# Patient Record
Sex: Female | Born: 1985 | Race: Black or African American | Hispanic: No | Marital: Single | Smoking: Former smoker
Health system: Southern US, Community
[De-identification: ages and names within clinical notes are randomized; demographics above are authoritative.]

## PROBLEM LIST (undated history)

## (undated) ENCOUNTER — Inpatient Hospital Stay (HOSPITAL_COMMUNITY): Payer: Self-pay

## (undated) DIAGNOSIS — K589 Irritable bowel syndrome without diarrhea: Secondary | ICD-10-CM

## (undated) DIAGNOSIS — B977 Papillomavirus as the cause of diseases classified elsewhere: Secondary | ICD-10-CM

## (undated) DIAGNOSIS — F32A Depression, unspecified: Secondary | ICD-10-CM

## (undated) DIAGNOSIS — F419 Anxiety disorder, unspecified: Secondary | ICD-10-CM

## (undated) DIAGNOSIS — K219 Gastro-esophageal reflux disease without esophagitis: Secondary | ICD-10-CM

## (undated) DIAGNOSIS — E559 Vitamin D deficiency, unspecified: Secondary | ICD-10-CM

## (undated) DIAGNOSIS — D649 Anemia, unspecified: Secondary | ICD-10-CM

## (undated) DIAGNOSIS — F329 Major depressive disorder, single episode, unspecified: Secondary | ICD-10-CM

## (undated) HISTORY — DX: Vitamin D deficiency, unspecified: E55.9

## (undated) HISTORY — PX: ESOPHAGOGASTRODUODENOSCOPY ENDOSCOPY: SHX5814

## (undated) HISTORY — PX: NO PAST SURGERIES: SHX2092

## (undated) HISTORY — DX: Papillomavirus as the cause of diseases classified elsewhere: B97.7

---
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
## 2006-08-27 ENCOUNTER — Ambulatory Visit: Payer: Self-pay | Admitting: Family Medicine

## 2006-08-27 ENCOUNTER — Inpatient Hospital Stay (HOSPITAL_COMMUNITY): Admission: AD | Admit: 2006-08-27 | Discharge: 2006-08-27 | Payer: Self-pay | Admitting: Obstetrics and Gynecology

## 2006-09-17 ENCOUNTER — Ambulatory Visit (HOSPITAL_COMMUNITY): Admission: RE | Admit: 2006-09-17 | Discharge: 2006-09-17 | Payer: Self-pay | Admitting: Family Medicine

## 2007-02-01 ENCOUNTER — Inpatient Hospital Stay (HOSPITAL_COMMUNITY): Admission: AD | Admit: 2007-02-01 | Discharge: 2007-02-01 | Payer: Self-pay | Admitting: Obstetrics and Gynecology

## 2007-02-02 ENCOUNTER — Inpatient Hospital Stay (HOSPITAL_COMMUNITY): Admission: AD | Admit: 2007-02-02 | Discharge: 2007-02-02 | Payer: Self-pay | Admitting: Obstetrics and Gynecology

## 2007-02-04 ENCOUNTER — Inpatient Hospital Stay (HOSPITAL_COMMUNITY): Admission: AD | Admit: 2007-02-04 | Discharge: 2007-02-07 | Payer: Self-pay | Admitting: Obstetrics and Gynecology

## 2007-02-05 ENCOUNTER — Encounter (INDEPENDENT_AMBULATORY_CARE_PROVIDER_SITE_OTHER): Payer: Self-pay | Admitting: Obstetrics and Gynecology

## 2007-10-03 ENCOUNTER — Emergency Department (HOSPITAL_COMMUNITY): Admission: EM | Admit: 2007-10-03 | Discharge: 2007-10-03 | Payer: Self-pay | Admitting: Emergency Medicine

## 2008-04-15 ENCOUNTER — Emergency Department (HOSPITAL_COMMUNITY): Admission: EM | Admit: 2008-04-15 | Discharge: 2008-04-15 | Payer: Self-pay | Admitting: Emergency Medicine

## 2008-11-13 ENCOUNTER — Emergency Department (HOSPITAL_COMMUNITY): Admission: EM | Admit: 2008-11-13 | Discharge: 2008-11-13 | Payer: Self-pay | Admitting: Emergency Medicine

## 2009-04-04 ENCOUNTER — Encounter (INDEPENDENT_AMBULATORY_CARE_PROVIDER_SITE_OTHER): Payer: Self-pay | Admitting: *Deleted

## 2009-04-04 ENCOUNTER — Emergency Department (HOSPITAL_COMMUNITY): Admission: EM | Admit: 2009-04-04 | Discharge: 2009-04-04 | Payer: Self-pay | Admitting: Emergency Medicine

## 2009-04-05 ENCOUNTER — Encounter (INDEPENDENT_AMBULATORY_CARE_PROVIDER_SITE_OTHER): Payer: Self-pay | Admitting: *Deleted

## 2009-04-26 ENCOUNTER — Ambulatory Visit: Payer: Self-pay | Admitting: Gastroenterology

## 2009-04-26 DIAGNOSIS — R1013 Epigastric pain: Secondary | ICD-10-CM

## 2009-04-26 DIAGNOSIS — K219 Gastro-esophageal reflux disease without esophagitis: Secondary | ICD-10-CM | POA: Insufficient documentation

## 2009-04-26 DIAGNOSIS — K625 Hemorrhage of anus and rectum: Secondary | ICD-10-CM | POA: Insufficient documentation

## 2009-04-27 ENCOUNTER — Ambulatory Visit: Payer: Self-pay | Admitting: Gastroenterology

## 2009-04-28 ENCOUNTER — Telehealth: Payer: Self-pay | Admitting: Gastroenterology

## 2009-04-29 ENCOUNTER — Telehealth: Payer: Self-pay | Admitting: Gastroenterology

## 2009-09-25 ENCOUNTER — Emergency Department (HOSPITAL_COMMUNITY): Admission: EM | Admit: 2009-09-25 | Discharge: 2009-09-26 | Payer: Self-pay | Admitting: Emergency Medicine

## 2009-10-04 ENCOUNTER — Emergency Department (HOSPITAL_COMMUNITY): Admission: EM | Admit: 2009-10-04 | Discharge: 2009-10-04 | Payer: Self-pay | Admitting: Emergency Medicine

## 2009-10-19 ENCOUNTER — Emergency Department (HOSPITAL_COMMUNITY)
Admission: EM | Admit: 2009-10-19 | Discharge: 2009-10-19 | Payer: Self-pay | Source: Home / Self Care | Admitting: Family Medicine

## 2009-10-19 ENCOUNTER — Emergency Department (HOSPITAL_COMMUNITY)
Admission: EM | Admit: 2009-10-19 | Discharge: 2009-10-19 | Payer: Self-pay | Source: Home / Self Care | Admitting: Emergency Medicine

## 2009-10-24 ENCOUNTER — Emergency Department (HOSPITAL_COMMUNITY)
Admission: EM | Admit: 2009-10-24 | Discharge: 2009-10-24 | Payer: Self-pay | Source: Home / Self Care | Admitting: Emergency Medicine

## 2009-12-18 ENCOUNTER — Emergency Department (HOSPITAL_COMMUNITY): Admission: EM | Admit: 2009-12-18 | Discharge: 2009-12-18 | Payer: Self-pay | Admitting: Emergency Medicine

## 2010-01-14 ENCOUNTER — Emergency Department (HOSPITAL_COMMUNITY)
Admission: EM | Admit: 2010-01-14 | Discharge: 2010-01-14 | Payer: Self-pay | Source: Home / Self Care | Admitting: Emergency Medicine

## 2010-02-20 ENCOUNTER — Emergency Department (HOSPITAL_COMMUNITY)
Admission: EM | Admit: 2010-02-20 | Discharge: 2010-02-20 | Payer: Self-pay | Source: Home / Self Care | Admitting: Family Medicine

## 2010-03-16 ENCOUNTER — Emergency Department (HOSPITAL_COMMUNITY)
Admission: EM | Admit: 2010-03-16 | Discharge: 2010-03-16 | Payer: Self-pay | Source: Home / Self Care | Admitting: Family Medicine

## 2010-03-16 LAB — POCT URINALYSIS DIPSTICK
Bilirubin Urine: NEGATIVE
Hemoglobin, Urine: NEGATIVE
Ketones, ur: NEGATIVE mg/dL
Nitrite: NEGATIVE
Protein, ur: NEGATIVE mg/dL
Specific Gravity, Urine: 1.025 (ref 1.005–1.030)
Urine Glucose, Fasting: NEGATIVE mg/dL
Urobilinogen, UA: 0.2 mg/dL (ref 0.0–1.0)
pH: 6 (ref 5.0–8.0)

## 2010-03-16 LAB — WET PREP, GENITAL
Trich, Wet Prep: NONE SEEN
Yeast Wet Prep HPF POC: NONE SEEN

## 2010-03-16 LAB — POCT PREGNANCY, URINE: Preg Test, Ur: NEGATIVE

## 2010-03-17 LAB — GC/CHLAMYDIA PROBE AMP, GENITAL
Chlamydia, DNA Probe: NEGATIVE
GC Probe Amp, Genital: NEGATIVE

## 2010-04-12 NOTE — Progress Notes (Signed)
Summary: Call Report  Phone Note Other Incoming   Caller: [facility] Call Report Summary of Call: [facility] Triage Call Report Triage Record Num: [id number] Operator: Patriciaann Clan Patient Name: Jodi Cochran Call Date & Time: 04/28/2009 7:58:14PM Patient Phone: [phone] PCP: Barbette Hair. Arlyce Dice Patient Gender: Female PCP Fax : Patient DOB: 1985/09/19 Practice Name: [facility] Reason for Call: LMP 04/13/2009. Patient calling. States she had upper endoscopy 04/27/09. Patient states she developed fever, onset 04/28/09 1820. Temp. 99.2 @1930  04/28/09. States has had nasal congestion. Onset X 3 weeks. Patient states feels fullness and pressure in face. Patient advised, saline nasal washes, inhaled steam, warm compresses to face, fluids. Call back parameters reviewed. Patient advised to call office 04/29/09 a.m. to schedule appt. within 24 hours. Patient also states developed pain and burning in left lower leg, onset 04/28/09 1900. Care advice given. Call back parameters reviewed. Protocol(s) Used: Upper Respiratory Infections / Colds Recommended Outcome per Protocol: See Provider within 24 hours Reason for Outcome: Facial pain (fullness, pressure, worsens with bending over), frontal headache, yellow-green nasal discharge AND any temperature elevation Care Advice:  ~ Use a cool mist humidifier to moisten air. Be sure to clean according to manufacturer's instructions.  ~ See provider in 8 hours if redness, swelling and tenderness develops above or below eyes/near ears/over sinuses  ~ Consider use of a saline nasal spray per package directions to help relieve nasal congestion. Consider acetaminophen as directed on label or by pharmacist/provider for pain or fever. PRECAUTIONS: - Use only If there is no history of liver disease, alcoholism, or intake of three or more alcohol drinks per day. - If approved by provider when breastfeeding. - Do not exceed recommended dose or frequency.  ~  ~ SYMPTOM / CONDITION MANAGEMENT A warm, moist compress placed on face, over eyes for 15 to 20 minutes, 5 to 6 times a day, may help relieve the congestion.  Initial call taken by: Margaret Pyle, CMA,  April 29, 2009 8:31 AM  Follow-up for Phone Call        ok Follow-up by: Louis Meckel MD,  April 29, 2009 11:12 AM     Appended Document: Call Report Calling to get a condition update from patient, unable to leave a message on pt. phone, I will try again later.   Appended Document: Call Report Pt. states "I am doing fine" Pt. instructed to call back as needed.

## 2010-04-12 NOTE — Progress Notes (Signed)
Summary: Call Report  Phone Note Other Incoming   Caller: [facility] Call Report Summary of Call: [facility] Triage Call Report Triage Record Num: [id number] Operator: Patriciaann Clan Patient Name: Jodi Cochran Call Date & Time: 04/28/2009 7:58:14PM Patient Phone: [phone] PCP: Barbette Hair. Arlyce Dice Patient Gender: Female PCP Fax : Patient DOB: 1986-01-08 Practice Name: [facility] Reason for Call: LMP 04/13/2009. Patient calling. States she had upper endoscopy 04/27/09. Patient states she developed fever, onset 04/28/09 1820. Temp. 99.2 @1930  04/28/09. States has had nasal congestion. Onset X 3 weeks. Patient states feels fullness and pressure in face. Patient advised, saline nasal washes, inhaled steam, warm compresses to face, fluids. Call back parameters reviewed. Patient advised to call office 04/29/09 a.m. to schedule appt. within 24 hours. Patient also states developed pain and burning in left lower leg, onset 04/28/09 1900. Care advice given. Call back parameters reviewed. Protocol(s) Used: Lower Leg Non-Injury Recommended Outcome per Protocol: See Provider within 24 hours Reason for Outcome: New onset mild to moderate pain that has not improved with 24 hours of home care Care Advice:  ~ Call provider if symptoms worsen or new symptoms develop. Consider acetaminophen as directed on label or by pharmacist/provider for pain or fever PRECAUTIONS: - If there is no history of liver disease, alcoholism, or intake of three or more alcohol drinks per day - If approved by provider during pregnancy or when breastfeeding - During pregnancy, acetaminophen should not be taken more than 3 consecutive days without telling provider - Do not exceed recommended dose or frequency  ~ Position affected part so it is elevated at least 12 inches (30 cm) above level of heart to improve circulation and decrease discomfort.  ~  Initial call taken by: Margaret Pyle, CMA,  April 29, 2009  8:35 AM

## 2010-04-12 NOTE — Letter (Signed)
Summary: Out of Work  [facility]  [address] [place], [place] [address]   Phone: [phone]  Fax: [phone]    04/26/2009  TO: WHOM IT MAY CONCERN  RE: Jodi Cochran [address] [place]       The above named individual is currently under my care and will be out of work    FROM: 04/26/2009   THROUGH: 04/28/2009    REASON: PROCEDURES    MAY RETURN ON: 04/28/2009     If you have any further questions or need additional information, please call.     Sincerely,    typed by: Merri Ray CMA (AAMA)

## 2010-04-12 NOTE — Progress Notes (Signed)
Summary: phone note  Phone Note Call from Patient   Summary of Call: Pt returning call from follow up call this a.m. after procedure.  Follow-up for Phone Call        Pt states she is having no pain, able to eat without problems.  Reviewed report with pt.  Instructed to call office in the next few days to set up follow up appt for 2-3 weeks. No further questions Follow-up by: Karl Bales RN,  April 28, 2009 9:28 AM

## 2010-04-12 NOTE — Letter (Signed)
Summary: Results Letter  [facility]  [address] [place], [place] [address]   Phone: [phone]  Fax: [phone]        April 26, 2009 MRN: [id number]    Jodi Cochran [address] [place], [place]  [address]    Dear [name],  It is my pleasure to have treated you recently as a new patient in my office. I appreciate your confidence and the opportunity to participate in your care.  Since I do have a busy inpatient endoscopy schedule and office schedule, my office hours vary weekly. I am, however, available for emergency calls everyday through my office. If I am not available for an urgent office appointment, another one of our gastroenterologist will be able to assist you.  My well-trained staff are prepared to help you at all times. For emergencies after office hours, a physician from our Gastroenterology section is always available through my 24 hour answering service  Once again I welcome you as a new patient and I look forward to a happy and healthy relationship             Sincerely,  Louis Meckel MD  This letter has been electronically signed by your physician.  Appended Document: Results Letter letter mailed

## 2010-04-12 NOTE — Letter (Signed)
Summary: EGD Instructions  [facility]  [address] [place], [place] [address]   Phone: [phone]  Fax: [phone]       Jodi Cochran    [birth year]    MRN: [id number]       Procedure Day /Date:TUESDAY 04/27/2009     Arrival Time: 10:30AM     Procedure Time:11:30AM     Location of Procedure:                    X  [facility] (4th Floor)   PREPARATION FOR ENDOSCOPY   On 04/27/2009  THE DAY OF THE PROCEDURE:  1.   No solid foods, milk or milk products are allowed after midnight the night before your procedure.  2.   Do not drink anything colored red or purple.  Avoid juices with pulp.  No orange juice.  3.  You may drink clear liquids until 9:30AM which is 2 hours before your procedure.                                                                                                CLEAR LIQUIDS INCLUDE: Water Jello Ice Popsicles Tea (sugar ok, no milk/cream) Powdered fruit flavored drinks Coffee (sugar ok, no milk/cream) Gatorade Juice: apple, white grape, white cranberry  Lemonade Clear bullion, consomm, broth Carbonated beverages (any kind) Strained chicken noodle soup Hard Candy   MEDICATION INSTRUCTIONS  Unless otherwise instructed, you should take regular prescription medications with a small sip of water as early as possible the morning of your procedure.            OTHER INSTRUCTIONS  You will need a responsible adult at least 25 years of age to accompany you and drive you home.   This person must remain in the waiting room during your procedure.  Wear loose fitting clothing that is easily removed.  Leave jewelry and other valuables at home.  However, you may wish to bring a book to read or an iPod/MP3 player to listen to music as you wait for your procedure to start.  Remove all body piercing jewelry and leave at home.  Total time from sign-in until discharge is approximately 2-3 hours.  You should go home  directly after your procedure and rest.  You can resume normal activities the day after your procedure.  The day of your procedure you should not:   Drive   Make legal decisions   Operate machinery   Drink alcohol   Return to work  You will receive specific instructions about eating, activities and medications before you leave.    The above instructions have been reviewed and explained to me by   _______________________    I fully understand and can verbalize these instructions _____________________________ Date _________

## 2010-04-12 NOTE — Procedures (Signed)
Summary: Upper Endoscopy  Patient: Jodi Cochran Note: All result statuses are Final unless otherwise noted.  Tests: (1) Upper Endoscopy (EGD)   EGD Upper Endoscopy       DONE     [facility]     [address]     [place], [place]  [address]           ENDOSCOPY PROCEDURE REPORT           PATIENT:  Jodi, Cochran  MR#:  [id number]     BIRTHDATE:  1985/10/05, 23 yrs. old  GENDER:  female           ENDOSCOPIST:  Barbette Hair. Arlyce Dice, MD     Referred by:           PROCEDURE DATE:  04/27/2009     PROCEDURE:  EGD, diagnostic     ASA CLASS:  Class I     INDICATIONS:  abdominal pain, GERD           MEDICATIONS:   Fentanyl 50 mcg IV, Versed 5 mg IV, glycopyrrolate     (Robinal) 0.2 mg IV, 0.6cc simethancone 0.6 cc PO     TOPICAL ANESTHETIC:  Exactacain Spray           DESCRIPTION OF PROCEDURE:   After the risks benefits and     alternatives of the procedure were thoroughly explained, informed     consent was obtained.  The [facility] GIF-H180 [id number] endoscope was     introduced through the mouth and advanced to the third portion of     the duodenum, without limitations.  The instrument was slowly     withdrawn as the mucosa was fully examined.     <<PROCEDUREIMAGES>>           The upper, middle, and distal third of the esophagus were     carefully inspected and no abnormalities were noted. The z-line     was well seen at the GEJ. The endoscope was pushed into the fundus     which was normal including a retroflexed view. The antrum,gastric     body, first and second part of the duodenum were unremarkable (see     image1, image2, image3, image4, image5, image6, image7, and     image9).    Retroflexed views revealed no abnormalities.    The     scope was then withdrawn from the patient and the procedure     completed.           COMPLICATIONS:  None           ENDOSCOPIC IMPRESSION:     1) Normal EGD     RECOMMENDATIONS:     1) continue PPI - dexilant     2) Call office next 2-3  days to schedule an office appointment     for 2-3 weeks           REPEAT EXAM:  No           ______________________________     Barbette Hair. Arlyce Dice, MD           CC:  Erlinda Hong MD           n.     Rosalie DoctorBarbette Hair. Zara Wendt at 04/27/2009 02:29 PM           [name], [id number]  Note: An exclamation mark (!) indicates a result that was not dispersed into the flowsheet. Document Creation Date: 04/27/2009 2:29  PM _______________________________________________________________________  (1) Order result status: Final Collection or observation date-time: 04/27/2009 14:25 Requested date-time:  Receipt date-time:  Reported date-time:  Referring Physician:   Ordering Physician: Melvia Heaps [phone]) Specimen Source:  Source: Launa Grill Order Number: [id number] Lab site:

## 2010-04-12 NOTE — Letter (Signed)
Summary: New Patient letter  [facility]  [address] [place], [place] [address]   Phone: [phone]  Fax: [phone]       04/05/2009 MRN: [id number]  Jodi Cochran [address] [place], [place]  [address]  Dear Ms. Saephanh,  Welcome to the Gastroenterology Division at [facility].    You are scheduled to see Dr.  Arlyce Dice on 04/26/2009 at 2:00PM on the 3rd floor at [facility], [address]  We ask that you try to arrive at our office 15 minutes prior to your appointment time to allow for check-in.  We would like you to complete the enclosed self-administered evaluation form prior to your visit and bring it with you on the day of your appointment.  We will review it with you.  Also, please bring a complete list of all your medications or, if you prefer, bring the medication bottles and we will list them.  Co-payments are due at the time of your visit and may be paid by cash, check or credit card.  Being a new patient to our practice and self-pay, your co-payment required at the time of check-in is $184.00.     Your office visit will consist of a consult with your physician (includes a physical exam), any laboratory testing he/she may order, scheduling of any necessary diagnostic testing (e.g. x-ray, ultrasound, CT-scan), and scheduling of a procedure (e.g. Endoscopy, Colonoscopy) if required.  Please allow enough time on your schedule to allow for any/all of these possibilities.    If you cannot keep your appointment, please call [phone] to cancel or reschedule prior to your appointment date.  This allows [place] the opportunity to schedule an appointment for another patient in need of care.  If you do not cancel or reschedule by 5 p.m. the business day prior to your appointment date, you will be charged a $50.00 late cancellation/no-show fee.    Thank you for choosing [facility] for your medical needs.  We appreciate the opportunity to care  for you.  Please visit [place] at our website  to learn more about our practice.                     Sincerely,                                                             The Gastroenterology Division

## 2010-04-12 NOTE — Assessment & Plan Note (Signed)
Summary: poss ulcer...as.   History of Present Illness Visit Type: Initial Visit Primary GI MD: Melvia Heaps MD [facility] Primary Provider: Erlinda Hong, MD Chief Complaint: constant burning in abdomen History of Present Illness:   Ms. Jodi Cochran is a 25 year old Afro-American female referred at the request of the [facility] for evaluation of abdominal pain.  She was seen 3 weeks ago for burning upper epigastric pain.  Workup including CBC, comprehensive metabolic panel, urinalysis and lipase were negative.  She was Hemoccult negative.  She has a history of pyrosis for which she had been taking Prevacid.  Prevacid initially helped the symptoms but symptoms recurred and worsened.  She takes ibuprofen sparingly.  For the past 3 weeks she has been off PPIs and complains of severe burning chest and upper abdominal discomfort.  It interferes with her sleep.  She has constant nausea though she has not thrown up.  She seen some blood on the toilet tissue with a bowel movement.  Her upper abdominal pain may radiate to both sides and to the back.  With the bowel movement she may have crampy lower, abdominal pain.   GI Review of Systems    Reports abdominal pain, acid reflux, belching, bloating, chest pain, loss of appetite, and  nausea.     Location of  Abdominal pain: epigastric area.    Denies dysphagia with liquids, dysphagia with solids, heartburn, vomiting, vomiting blood, weight loss, and  weight gain.      Reports black tarry stools, change in bowel habits, rectal bleeding, and  rectal pain.     Denies anal fissure, constipation, diarrhea, diverticulosis, fecal incontinence, heme positive stool, hemorrhoids, irritable bowel syndrome, jaundice, light color stool, and  liver problems. Preventive Screening-Counseling & Management  Alcohol-Tobacco     Smoking Status: quit      Drug Use:  no.      Current Medications (verified): 1)  Zicam Nasal Spray .... As Needed 2)  Ibuprofen 200 Mg Tabs  (Ibuprofen) .... As Needed For Pain  Allergies (verified): 1)  ! * Zofran  Past History:  Past Medical History: Hypertension  Past Surgical History: Unremarkable  Family History: No FH of Colon Cancer: Family History of Diabetes: GM  Family History of Heart Disease: Uncle  Social History: Occupation: Early Childhood Education Patient is a former smoker.  Alcohol Use - no Daily Caffeine Use Illicit Drug Use - no Smoking Status:  quit Drug Use:  no  Review of Systems       The patient complains of allergy/sinus, back pain, headaches-new, night sweats, and shortness of breath.         All other systems were reviewed and were negative   Vital Signs:  Patient profile:   25 year old female Height:      66.25 inches Weight:      126.50 pounds BMI:     20.34 Pulse rate:   68 / minute Pulse rhythm:   regular BP sitting:   112 / 68  (left arm) Cuff size:   regular  Vitals Entered By: June McMurray CMA Duncan Dull) (April 26, 2009 2:15 PM)  Physical Exam  Additional Exam:  She is a thin female in no acute distress  skin: anicteric HEENT: normocephalic; PEERLA; no nasal or pharyngeal abnormalities neck: supple nodes: no cervical lymphadenopathy chest: clear to ausculatation and percussion heart: no murmurs, gallops, or rubs abd: soft, nontender; BS normoactive; no abdominal masses, , organomegaly; there is mild tenderness to palpation in the midepigastrium rectal:  deferred ext: no cynanosis, clubbing, edema skeletal: no deformities neuro: oriented x 3; no focal abnormalities    Impression & Recommendations:  Problem # 1:  ABDOMINAL PAIN, EPIGASTRIC (ICD-789.06) Symptoms could be due to bird with or without ulcer or nonulcer dyspepsia.  Recommendations #1 child DEXILANT 60 mg daily #2 upper endoscopy  Risks, alternatives, and complications of the procedure, including bleeding, perforation, and possible need for surgery, were explained to the patient.   Patient's questions were answered.  Orders: EGD (EGD)  Problem # 2:  ESOPHAGEAL REFLUX (ICD-530.81) Plan trial of DEXILANT Orders: EGD (EGD)  Problem # 3:  HEMORRHAGE OF RECTUM AND ANUS (ICD-569.3) This is most likely related to hemorrhoidal bleeding.  Patient is Hemoccult-negative at her previousr visit.  Recommendations #1 no further workup unless bleeding persists at which point I would consider a sigmoidoscopy  Patient Instructions: 1)  Conscious Sedation brochure given.  2)  Upper Endoscopy brochure given.

## 2010-05-24 LAB — POCT URINALYSIS DIPSTICK
Bilirubin Urine: NEGATIVE
Glucose, UA: NEGATIVE mg/dL
Hgb urine dipstick: NEGATIVE
Ketones, ur: NEGATIVE mg/dL
Nitrite: NEGATIVE
Protein, ur: NEGATIVE mg/dL
Specific Gravity, Urine: 1.02 (ref 1.005–1.030)
Urobilinogen, UA: 0.2 mg/dL (ref 0.0–1.0)
pH: 7 (ref 5.0–8.0)

## 2010-05-24 LAB — WET PREP, GENITAL
Trich, Wet Prep: NONE SEEN
Yeast Wet Prep HPF POC: NONE SEEN

## 2010-05-24 LAB — POCT PREGNANCY, URINE: Preg Test, Ur: NEGATIVE

## 2010-05-24 LAB — GC/CHLAMYDIA PROBE AMP, GENITAL
Chlamydia, DNA Probe: NEGATIVE
GC Probe Amp, Genital: NEGATIVE

## 2010-05-26 LAB — POCT URINALYSIS DIPSTICK
Bilirubin Urine: NEGATIVE
Glucose, UA: NEGATIVE mg/dL
Hgb urine dipstick: NEGATIVE
Ketones, ur: NEGATIVE mg/dL
Nitrite: NEGATIVE
Protein, ur: NEGATIVE mg/dL
Specific Gravity, Urine: 1.02 (ref 1.005–1.030)
Urobilinogen, UA: 0.2 mg/dL (ref 0.0–1.0)
pH: 7 (ref 5.0–8.0)

## 2010-05-26 LAB — POCT PREGNANCY, URINE: Preg Test, Ur: NEGATIVE

## 2010-05-27 LAB — WET PREP, GENITAL
Clue Cells Wet Prep HPF POC: NONE SEEN
Trich, Wet Prep: NONE SEEN
Yeast Wet Prep HPF POC: NONE SEEN

## 2010-05-27 LAB — POCT URINALYSIS DIPSTICK
Hgb urine dipstick: NEGATIVE
Ketones, ur: NEGATIVE mg/dL
Protein, ur: NEGATIVE mg/dL
Specific Gravity, Urine: 1.025 (ref 1.005–1.030)

## 2010-05-27 LAB — CBC
HCT: 37.8 % (ref 36.0–46.0)
Hemoglobin: 12.7 g/dL (ref 12.0–15.0)
MCH: 28.9 pg (ref 26.0–34.0)
MCHC: 33.7 g/dL (ref 30.0–36.0)
MCHC: 33.1 g/dL (ref 30.0–36.0)
MCV: 86 fL (ref 78.0–100.0)
Platelets: 236 10*3/uL (ref 150–400)
Platelets: 245 10*3/uL (ref 150–400)
RBC: 4.4 MIL/uL (ref 3.87–5.11)
RDW: 12.7 % (ref 11.5–15.5)
RDW: 12.7 % (ref 11.5–15.5)
WBC: 6.7 10*3/uL (ref 4.0–10.5)

## 2010-05-27 LAB — URINALYSIS, ROUTINE W REFLEX MICROSCOPIC
Bilirubin Urine: NEGATIVE
Glucose, UA: NEGATIVE mg/dL
Glucose, UA: NEGATIVE mg/dL
Hgb urine dipstick: NEGATIVE
Ketones, ur: NEGATIVE mg/dL
Nitrite: NEGATIVE
Protein, ur: NEGATIVE mg/dL
Specific Gravity, Urine: 1.005 (ref 1.005–1.030)
Urobilinogen, UA: 0.2 mg/dL (ref 0.0–1.0)
pH: 6 (ref 5.0–8.0)
pH: 5.5 (ref 5.0–8.0)

## 2010-05-27 LAB — POCT I-STAT, CHEM 8
BUN: 17 mg/dL (ref 6–23)
BUN: 13 mg/dL (ref 6–23)
Calcium, Ion: 1.07 mmol/L — ABNORMAL LOW (ref 1.12–1.32)
Calcium, Ion: 1.22 mmol/L (ref 1.12–1.32)
Calcium, Ion: 1.23 mmol/L (ref 1.12–1.32)
Chloride: 107 mEq/L (ref 96–112)
Chloride: 106 mEq/L (ref 96–112)
Chloride: 106 mEq/L (ref 96–112)
Creatinine, Ser: 0.8 mg/dL (ref 0.4–1.2)
Creatinine, Ser: 0.6 mg/dL (ref 0.4–1.2)
Glucose, Bld: 85 mg/dL (ref 70–99)
HCT: 41 % (ref 36.0–46.0)
HCT: 37 % (ref 36.0–46.0)
Hemoglobin: 13.9 g/dL (ref 12.0–15.0)
Potassium: 6.8 mEq/L (ref 3.5–5.1)
Potassium: 3.6 mEq/L (ref 3.5–5.1)
Sodium: 132 mEq/L — ABNORMAL LOW (ref 135–145)
TCO2: 23 mmol/L (ref 0–100)

## 2010-05-27 LAB — URINE CULTURE
Colony Count: NO GROWTH
Culture  Setup Time: 201108142122
Culture: NO GROWTH

## 2010-05-27 LAB — BASIC METABOLIC PANEL
BUN: 12 mg/dL (ref 6–23)
CO2: 22 mEq/L (ref 19–32)
Calcium: 9.7 mg/dL (ref 8.4–10.5)
Chloride: 107 mEq/L (ref 96–112)
Creatinine, Ser: 0.7 mg/dL (ref 0.4–1.2)
GFR calc Af Amer: >60 mL/min (ref >60)
GFR calc non Af Amer: >60 mL/min (ref >60)
Glucose, Bld: 91 mg/dL (ref 70–99)
Potassium: 4.1 mEq/L (ref 3.5–5.1)
Sodium: 135 mEq/L (ref 135–145)

## 2010-05-27 LAB — POCT PREGNANCY, URINE
Preg Test, Ur: NEGATIVE
Preg Test, Ur: NEGATIVE
Preg Test, Ur: NEGATIVE

## 2010-05-27 LAB — DIFFERENTIAL
Basophils Absolute: 0 10*3/uL (ref 0.0–0.1)
Basophils Absolute: 0 10*3/uL (ref 0.0–0.1)
Basophils Relative: 1 % (ref 0–1)
Basophils Relative: 0 % (ref 0–1)
Eosinophils Absolute: 0.1 10*3/uL (ref 0.0–0.7)
Eosinophils Relative: 2 % (ref 0–5)
Lymphocytes Relative: 26 % (ref 12–46)
Lymphs Abs: 1.7 10*3/uL (ref 0.7–4.0)
Monocytes Absolute: 0.4 10*3/uL (ref 0.1–1.0)
Monocytes Relative: 6 % (ref 3–12)
Neutro Abs: 4.4 10*3/uL (ref 1.7–7.7)
Neutro Abs: 3.2 10*3/uL (ref 1.7–7.7)
Neutrophils Relative %: 66 % (ref 43–77)
Neutrophils Relative %: 52 % (ref 43–77)

## 2010-05-27 LAB — URINE MICROSCOPIC-ADD ON

## 2010-05-27 LAB — GC/CHLAMYDIA PROBE AMP, GENITAL: Chlamydia, DNA Probe: NEGATIVE

## 2010-05-28 LAB — POCT PREGNANCY, URINE: Preg Test, Ur: NEGATIVE

## 2010-05-28 LAB — COMPREHENSIVE METABOLIC PANEL
Alkaline Phosphatase: 72 U/L (ref 39–117)
BUN: 10 mg/dL (ref 6–23)
CO2: 24 mEq/L (ref 19–32)
Chloride: 108 mEq/L (ref 96–112)
GFR calc non Af Amer: >60 mL/min (ref >60)
Glucose, Bld: 80 mg/dL (ref 70–99)
Potassium: 3.6 mEq/L (ref 3.5–5.1)
Total Bilirubin: 0.4 mg/dL (ref 0.3–1.2)
Total Protein: 7.2 g/dL (ref 6.0–8.3)

## 2010-05-28 LAB — URINALYSIS, ROUTINE W REFLEX MICROSCOPIC
Bilirubin Urine: NEGATIVE
Nitrite: NEGATIVE
Specific Gravity, Urine: 1.027 (ref 1.005–1.030)
Urobilinogen, UA: 0.2 mg/dL (ref 0.0–1.0)

## 2010-05-28 LAB — DIFFERENTIAL
Basophils Absolute: 0 10*3/uL (ref 0.0–0.1)
Basophils Relative: 0 % (ref 0–1)
Eosinophils Absolute: 0.1 10*3/uL (ref 0.0–0.7)
Neutro Abs: 3.9 10*3/uL (ref 1.7–7.7)
Neutrophils Relative %: 53 % (ref 43–77)

## 2010-05-28 LAB — CBC
HCT: 37.5 % (ref 36.0–46.0)
Hemoglobin: 12.6 g/dL (ref 12.0–15.0)
MCV: 87 fL (ref 78.0–100.0)
RDW: 12.7 % (ref 11.5–15.5)
WBC: 7.3 10*3/uL (ref 4.0–10.5)

## 2010-05-28 LAB — URINE MICROSCOPIC-ADD ON

## 2010-05-28 LAB — GC/CHLAMYDIA PROBE AMP, GENITAL: GC Probe Amp, Genital: NEGATIVE

## 2010-05-29 LAB — COMPREHENSIVE METABOLIC PANEL
ALT: 14 U/L (ref 0–35)
AST: 21 U/L (ref 0–37)
CO2: 24 mEq/L (ref 19–32)
Calcium: 9.2 mg/dL (ref 8.4–10.5)
Chloride: 103 mEq/L (ref 96–112)
Creatinine, Ser: 0.64 mg/dL (ref 0.4–1.2)
GFR calc non Af Amer: >60 mL/min (ref >60)
Glucose, Bld: 85 mg/dL (ref 70–99)
Sodium: 134 mEq/L — ABNORMAL LOW (ref 135–145)
Total Bilirubin: 0.1 mg/dL — ABNORMAL LOW (ref 0.3–1.2)

## 2010-05-29 LAB — CBC
HCT: 35.8 % — ABNORMAL LOW (ref 36.0–46.0)
Hemoglobin: 11.8 g/dL — ABNORMAL LOW (ref 12.0–15.0)
MCHC: 33 g/dL (ref 30.0–36.0)
MCV: 86 fL (ref 78.0–100.0)
Platelets: 230 10*3/uL (ref 150–400)
RBC: 4.16 MIL/uL (ref 3.87–5.11)
RDW: 13 % (ref 11.5–15.5)
WBC: 4.9 10*3/uL (ref 4.0–10.5)

## 2010-05-29 LAB — DIFFERENTIAL
Basophils Absolute: 0 10*3/uL (ref 0.0–0.1)
Basophils Relative: 0 % (ref 0–1)
Eosinophils Absolute: 0.1 10*3/uL (ref 0.0–0.7)
Eosinophils Relative: 2 % (ref 0–5)
Lymphocytes Relative: 44 % (ref 12–46)
Lymphs Abs: 2.1 10*3/uL (ref 0.7–4.0)
Monocytes Absolute: 0.5 10*3/uL (ref 0.1–1.0)
Monocytes Relative: 11 % (ref 3–12)
Neutro Abs: 2 10*3/uL (ref 1.7–7.7)
Neutrophils Relative %: 42 % — ABNORMAL LOW (ref 43–77)

## 2010-05-29 LAB — HEMOCCULT GUIAC POC 1CARD (OFFICE): Fecal Occult Bld: NEGATIVE

## 2010-05-29 LAB — LIPASE, BLOOD: Lipase: 32 U/L (ref 11–59)

## 2010-05-29 LAB — PREGNANCY, URINE: Preg Test, Ur: NEGATIVE

## 2010-05-31 ENCOUNTER — Inpatient Hospital Stay (INDEPENDENT_AMBULATORY_CARE_PROVIDER_SITE_OTHER)
Admission: RE | Admit: 2010-05-31 | Discharge: 2010-05-31 | Disposition: A | Discharge status: Home / Self Care | Payer: Self-pay | Source: Ambulatory Visit | Attending: Family Medicine | Admitting: Family Medicine

## 2010-05-31 DIAGNOSIS — R070 Pain in throat: Secondary | ICD-10-CM

## 2010-05-31 DIAGNOSIS — R110 Nausea: Secondary | ICD-10-CM

## 2010-05-31 DIAGNOSIS — N760 Acute vaginitis: Secondary | ICD-10-CM

## 2010-05-31 DIAGNOSIS — R197 Diarrhea, unspecified: Secondary | ICD-10-CM

## 2010-05-31 DIAGNOSIS — J019 Acute sinusitis, unspecified: Secondary | ICD-10-CM

## 2010-05-31 LAB — POCT URINALYSIS DIP (DEVICE)
Bilirubin Urine: NEGATIVE
Glucose, UA: NEGATIVE mg/dL
Hgb urine dipstick: NEGATIVE
Ketones, ur: NEGATIVE mg/dL
Nitrite: NEGATIVE
Protein, ur: NEGATIVE mg/dL
Specific Gravity, Urine: 1.02 (ref 1.005–1.030)
Urobilinogen, UA: 0.2 mg/dL (ref 0.0–1.0)
pH: 6.5 (ref 5.0–8.0)

## 2010-05-31 LAB — POCT PREGNANCY, URINE: Preg Test, Ur: NEGATIVE

## 2010-05-31 LAB — WET PREP, GENITAL
Trich, Wet Prep: NONE SEEN
Yeast Wet Prep HPF POC: NONE SEEN

## 2010-05-31 LAB — POCT RAPID STREP A (OFFICE): Streptococcus, Group A Screen (Direct): NEGATIVE

## 2010-06-01 LAB — GC/CHLAMYDIA PROBE AMP, GENITAL
Chlamydia, DNA Probe: NEGATIVE
GC Probe Amp, Genital: NEGATIVE

## 2010-06-28 LAB — COMPREHENSIVE METABOLIC PANEL
ALT: 12 U/L (ref 0–35)
AST: 16 U/L (ref 0–37)
Albumin: 3.9 g/dL (ref 3.5–5.2)
Calcium: 9 mg/dL (ref 8.4–10.5)
Creatinine, Ser: 0.69 mg/dL (ref 0.4–1.2)
GFR calc Af Amer: >60 mL/min (ref >60)
Sodium: 141 mEq/L (ref 135–145)
Total Protein: 6.3 g/dL (ref 6.0–8.3)

## 2010-06-28 LAB — DIFFERENTIAL
Eosinophils Absolute: 0 10*3/uL (ref 0.0–0.7)
Eosinophils Relative: 0 % (ref 0–5)
Lymphocytes Relative: 3 % — ABNORMAL LOW (ref 12–46)
Lymphs Abs: 0.3 10*3/uL — ABNORMAL LOW (ref 0.7–4.0)
Monocytes Relative: 4 % (ref 3–12)

## 2010-06-28 LAB — URINALYSIS, ROUTINE W REFLEX MICROSCOPIC
Nitrite: NEGATIVE
Protein, ur: NEGATIVE mg/dL
Urobilinogen, UA: 1 mg/dL (ref 0.0–1.0)
pH: 7 (ref 5.0–8.0)

## 2010-06-28 LAB — CBC
MCHC: 32.4 g/dL (ref 30.0–36.0)
Platelets: 217 10*3/uL (ref 150–400)
RBC: 4.27 MIL/uL (ref 3.87–5.11)
RDW: 13.6 % (ref 11.5–15.5)

## 2010-07-20 ENCOUNTER — Inpatient Hospital Stay (INDEPENDENT_AMBULATORY_CARE_PROVIDER_SITE_OTHER)
Admission: RE | Admit: 2010-07-20 | Discharge: 2010-07-20 | Disposition: A | Discharge status: Home / Self Care | Payer: Self-pay | Source: Ambulatory Visit | Attending: Family Medicine | Admitting: Family Medicine

## 2010-07-20 DIAGNOSIS — N760 Acute vaginitis: Secondary | ICD-10-CM

## 2010-07-20 DIAGNOSIS — B9689 Other specified bacterial agents as the cause of diseases classified elsewhere: Secondary | ICD-10-CM

## 2010-07-20 DIAGNOSIS — A499 Bacterial infection, unspecified: Secondary | ICD-10-CM

## 2010-07-20 LAB — POCT URINALYSIS DIP (DEVICE)
Hgb urine dipstick: NEGATIVE
Nitrite: NEGATIVE
Specific Gravity, Urine: >=1.03 (ref 1.005–1.030)
Urobilinogen, UA: 0.2 mg/dL (ref 0.0–1.0)
pH: 5.5 (ref 5.0–8.0)

## 2010-07-20 LAB — WET PREP, GENITAL
Trich, Wet Prep: NONE SEEN
Yeast Wet Prep HPF POC: NONE SEEN

## 2010-07-21 LAB — GC/CHLAMYDIA PROBE AMP, GENITAL
Chlamydia, DNA Probe: NEGATIVE
GC Probe Amp, Genital: NEGATIVE

## 2010-12-09 LAB — DIFFERENTIAL
Basophils Absolute: 0
Basophils Relative: 0
Eosinophils Absolute: 0.1
Eosinophils Relative: 1
Monocytes Absolute: 0.3
Monocytes Relative: 5
Neutro Abs: 3.1

## 2010-12-09 LAB — WET PREP, GENITAL: Trich, Wet Prep: NONE SEEN

## 2010-12-09 LAB — URINALYSIS, ROUTINE W REFLEX MICROSCOPIC
Bilirubin Urine: NEGATIVE
Hgb urine dipstick: NEGATIVE
Nitrite: NEGATIVE
Protein, ur: NEGATIVE
Specific Gravity, Urine: 1.023
Urobilinogen, UA: 0.2

## 2010-12-09 LAB — CBC
HCT: 39.9
Hemoglobin: 12.9
MCHC: 32.4
MCV: 86.7
RBC: 4.6
RDW: 14

## 2010-12-09 LAB — BASIC METABOLIC PANEL
CO2: 26
Chloride: 107
GFR calc Af Amer: >60
Glucose, Bld: 85
Potassium: 3.7
Sodium: 140

## 2010-12-09 LAB — GC/CHLAMYDIA PROBE AMP, GENITAL: Chlamydia, DNA Probe: NEGATIVE

## 2010-12-20 LAB — CBC
HCT: 20.6 — ABNORMAL LOW
HCT: 21.9 — ABNORMAL LOW
HCT: 28.2 — ABNORMAL LOW
HCT: 30 — ABNORMAL LOW
HCT: 30.6 — ABNORMAL LOW
Hemoglobin: 10.4 — ABNORMAL LOW
Hemoglobin: 10.6 — ABNORMAL LOW
Hemoglobin: 11.1 — ABNORMAL LOW
Hemoglobin: 7 — CL
Hemoglobin: 7.4 — CL
Hemoglobin: 9.7 — ABNORMAL LOW
MCHC: 33.3
MCHC: 34.6
MCV: 90
MCV: 91
MCV: 91.7
MCV: 93.7
Platelets: 225
Platelets: 242
Platelets: 258
Platelets: 295
RBC: 2.25 — ABNORMAL LOW
RBC: 3.43 — ABNORMAL LOW
RBC: 3.66 — ABNORMAL LOW
RDW: 13.4
RDW: 13.6
WBC: 10.8 — ABNORMAL HIGH
WBC: 11.5 — ABNORMAL HIGH
WBC: 17.8 — ABNORMAL HIGH

## 2010-12-20 LAB — COMPREHENSIVE METABOLIC PANEL
ALT: 16
Albumin: 2.8 — ABNORMAL LOW
Alkaline Phosphatase: 91
Alkaline Phosphatase: 96
BUN: 1 — ABNORMAL LOW
BUN: 2 — ABNORMAL LOW
CO2: 22
Calcium: 9.3
Chloride: 104
Creatinine, Ser: 0.38 — ABNORMAL LOW
GFR calc non Af Amer: >60
Glucose, Bld: 68 — ABNORMAL LOW
Glucose, Bld: 79
Sodium: 138
Total Bilirubin: 0.7
Total Protein: 5.7 — ABNORMAL LOW

## 2010-12-20 LAB — URINALYSIS, ROUTINE W REFLEX MICROSCOPIC
Ketones, ur: NEGATIVE
Leukocytes, UA: NEGATIVE
Nitrite: NEGATIVE
Protein, ur: NEGATIVE
Specific Gravity, Urine: 1.01
Urobilinogen, UA: 0.2
Urobilinogen, UA: 0.2

## 2010-12-20 LAB — CROSSMATCH

## 2010-12-20 LAB — URINE MICROSCOPIC-ADD ON

## 2010-12-20 LAB — RPR: RPR Ser Ql: NONREACTIVE

## 2010-12-20 LAB — ABO/RH: ABO/RH(D): O POS

## 2010-12-20 LAB — URIC ACID: Uric Acid, Serum: 2.9

## 2010-12-20 LAB — LACTATE DEHYDROGENASE: LDH: 173

## 2010-12-22 ENCOUNTER — Emergency Department (HOSPITAL_COMMUNITY)
Admission: EM | Admit: 2010-12-22 | Discharge: 2010-12-22 | Disposition: A | Discharge status: Home / Self Care | Payer: Self-pay | Attending: Emergency Medicine | Admitting: Emergency Medicine

## 2010-12-22 DIAGNOSIS — R109 Unspecified abdominal pain: Secondary | ICD-10-CM | POA: Insufficient documentation

## 2010-12-22 DIAGNOSIS — R112 Nausea with vomiting, unspecified: Secondary | ICD-10-CM | POA: Insufficient documentation

## 2010-12-22 DIAGNOSIS — K5289 Other specified noninfective gastroenteritis and colitis: Secondary | ICD-10-CM | POA: Insufficient documentation

## 2010-12-22 DIAGNOSIS — R350 Frequency of micturition: Secondary | ICD-10-CM | POA: Insufficient documentation

## 2010-12-22 DIAGNOSIS — R6883 Chills (without fever): Secondary | ICD-10-CM | POA: Insufficient documentation

## 2010-12-22 DIAGNOSIS — R197 Diarrhea, unspecified: Secondary | ICD-10-CM | POA: Insufficient documentation

## 2010-12-22 DIAGNOSIS — K219 Gastro-esophageal reflux disease without esophagitis: Secondary | ICD-10-CM | POA: Insufficient documentation

## 2010-12-22 LAB — DIFFERENTIAL
Basophils Absolute: 0 K/uL (ref 0.0–0.1)
Basophils Relative: 0 % (ref 0–1)
Eosinophils Absolute: 0 K/uL (ref 0.0–0.7)
Eosinophils Relative: 0 % (ref 0–5)
Lymphocytes Relative: 10 % — ABNORMAL LOW (ref 12–46)
Lymphs Abs: 0.9 K/uL (ref 0.7–4.0)
Monocytes Absolute: 0.4 K/uL (ref 0.1–1.0)
Monocytes Relative: 5 % (ref 3–12)
Neutro Abs: 7.6 K/uL (ref 1.7–7.7)
Neutrophils Relative %: 86 % — ABNORMAL HIGH (ref 43–77)

## 2010-12-22 LAB — POCT I-STAT, CHEM 8
BUN: 7 mg/dL (ref 6–23)
Calcium, Ion: 1.24 mmol/L (ref 1.12–1.32)
Chloride: 103 meq/L (ref 96–112)
Creatinine, Ser: 0.7 mg/dL (ref 0.50–1.10)
Glucose, Bld: 88 mg/dL (ref 70–99)
HCT: 40 % (ref 36.0–46.0)
Hemoglobin: 13.6 g/dL (ref 12.0–15.0)
Potassium: 3.7 meq/L (ref 3.5–5.1)
Sodium: 137 meq/L (ref 135–145)
TCO2: 24 mmol/L (ref 0–100)

## 2010-12-22 LAB — CBC
HCT: 36.3 % (ref 36.0–46.0)
Hemoglobin: 12.3 g/dL (ref 12.0–15.0)
MCH: 28.3 pg (ref 26.0–34.0)
MCHC: 33.9 g/dL (ref 30.0–36.0)
RDW: 12.7 % (ref 11.5–15.5)

## 2010-12-22 LAB — URINALYSIS, ROUTINE W REFLEX MICROSCOPIC
Bilirubin Urine: NEGATIVE
Glucose, UA: NEGATIVE mg/dL
Hgb urine dipstick: NEGATIVE
Protein, ur: NEGATIVE mg/dL
Urobilinogen, UA: 0.2 mg/dL (ref 0.0–1.0)

## 2010-12-22 LAB — POCT PREGNANCY, URINE: Preg Test, Ur: NEGATIVE

## 2010-12-28 LAB — DIFFERENTIAL
Eosinophils Absolute: 0
Eosinophils Relative: 0
Lymphs Abs: 0.6 — ABNORMAL LOW

## 2010-12-28 LAB — URINALYSIS, ROUTINE W REFLEX MICROSCOPIC
Bilirubin Urine: NEGATIVE
Glucose, UA: NEGATIVE
Hgb urine dipstick: NEGATIVE
Ketones, ur: NEGATIVE
Protein, ur: NEGATIVE

## 2010-12-28 LAB — BASIC METABOLIC PANEL
BUN: 2 — ABNORMAL LOW
CO2: 23
Chloride: 106
Glucose, Bld: 77
Potassium: 3.6

## 2010-12-28 LAB — CBC
HCT: 28.4 — ABNORMAL LOW
MCHC: 33.9
MCV: 84.7
Platelets: 216
WBC: 6.2

## 2011-05-14 ENCOUNTER — Encounter (HOSPITAL_COMMUNITY): Payer: Self-pay | Admitting: *Deleted

## 2011-05-14 ENCOUNTER — Emergency Department (INDEPENDENT_AMBULATORY_CARE_PROVIDER_SITE_OTHER)
Admission: EM | Admit: 2011-05-14 | Discharge: 2011-05-14 | Disposition: A | Discharge status: Home / Self Care | Payer: Self-pay | Source: Home / Self Care | Attending: Emergency Medicine | Admitting: Emergency Medicine

## 2011-05-14 DIAGNOSIS — J029 Acute pharyngitis, unspecified: Secondary | ICD-10-CM

## 2011-05-14 LAB — POCT PREGNANCY, URINE: Preg Test, Ur: NEGATIVE

## 2011-05-14 LAB — POCT RAPID STREP A: Streptococcus, Group A Screen (Direct): NEGATIVE

## 2011-05-14 MED ORDER — IBUPROFEN 600 MG PO TABS: 600.0000 mg | ORAL_TABLET | Freq: Four times a day (QID) | ORAL | Status: AC | PRN

## 2011-05-14 MED ORDER — ACETAMINOPHEN 325 MG PO TABS
975.0000 mg | ORAL_TABLET | Freq: Once | ORAL | Status: AC
  Administered 2011-05-14: 975 mg via ORAL

## 2011-05-14 MED ORDER — HYDROCODONE-ACETAMINOPHEN 5-325 MG PO TABS: 2.0000 | ORAL_TABLET | ORAL | Status: AC | PRN

## 2011-05-14 MED ORDER — ACETAMINOPHEN 325 MG PO TABS
ORAL_TABLET | ORAL | Status: AC
  Filled 2011-05-14: qty 3

## 2011-05-14 NOTE — ED Provider Notes (Signed)
History     CSN: [id number]  Arrival date & time 05/14/11  1117   First MD Initiated Contact with Patient 05/14/11 1122      Chief Complaint  Patient presents with  . Fever  . Sore Throat  . Otalgia    (Consider location/radiation/quality/duration/timing/severity/associated sxs/prior treatment) HPI Comments: Patient with sore throat, headache, bilateral earache and feeling feverish this morning. Ports some nasal congestion, postnasal drip. No aggravating, or alleviating factors. Hasn't tried anything for this. No voice changes, trismus, drooling, neck pain. No ear fullness, otorrhea, change in hearing. No nausea, vomiting, wheezing, chest pain, shortness of breath, abdominal pain. No urinary complaints, diarrhea. Patient works at a daycare, and has multiple sick contacts.   ROS as noted in HPI. All other ROS negative.   Patient is a 26 y.o. female presenting with fever, pharyngitis, and ear pain. The history is provided by the patient. No language interpreter was used.  Fever Primary symptoms of the febrile illness include fever. The current episode started today. This is a new problem.  Sore Throat  Otalgia    History reviewed. No pertinent past medical history.  History reviewed. No pertinent past surgical history.  History reviewed. No pertinent family history.  History  Substance Use Topics  . Smoking status: Former Games developer  . Smokeless tobacco: Not on file  . Alcohol Use: No    OB History    Grav Para Term Preterm Abortions TAB SAB Ect Mult Living                  Review of Systems  Constitutional: Positive for fever.  HENT: Positive for ear pain.     Allergies  Ondansetron hcl  Home Medications   Current Outpatient Rx  Name Route Sig Dispense Refill  . HYDROCODONE-ACETAMINOPHEN 5-325 MG PO TABS Oral Take 2 tablets by mouth every 4 (four) hours as needed for pain. 20 tablet 0  . IBUPROFEN 600 MG PO TABS Oral Take 1 tablet (600 mg total) by mouth every  6 (six) hours as needed for pain. 30 tablet 0    BP 135/88  Pulse 138  Temp(Src) 99.4 F (37.4 C) (Oral)  Resp 16  SpO2 98%  LMP 05/12/2011 Filed Vitals:   05/14/11 1257 05/14/11 1320  BP: 135/88   Pulse: 138   Temp: 101.2 F (38.4 C) 99.4 F (37.4 C)  TempSrc: Oral Oral  Resp: 16   SpO2: 98%     Physical Exam  Nursing note and vitals reviewed. Constitutional: She is oriented to person, place, and time. She appears well-developed and well-nourished. No distress.  HENT:  Head: Normocephalic and atraumatic.  Right Ear: Tympanic membrane and ear canal normal.  Left Ear: Tympanic membrane and ear canal normal.  Nose: Nose normal.  Mouth/Throat: Uvula is midline and mucous membranes are normal. Posterior oropharyngeal erythema present. No posterior oropharyngeal edema.       Enlarged, erythematous tonsils. No exudates.  Eyes: Conjunctivae and EOM are normal. Pupils are equal, round, and reactive to light.  Neck: Normal range of motion.  Cardiovascular: Normal rate, regular rhythm and normal heart sounds.   Pulmonary/Chest: Effort normal and breath sounds normal.  Abdominal: Soft. Bowel sounds are normal. She exhibits no distension. There is no splenomegaly.  Musculoskeletal: Normal range of motion.  Lymphadenopathy:    She has cervical adenopathy.  Neurological: She is alert and oriented to person, place, and time.  Skin: Skin is warm and dry.  Psychiatric: She has a normal mood  and affect. Her behavior is normal. Judgment and thought content normal.    ED Course  Procedures (including critical care time)   Labs Reviewed  POCT RAPID STREP A ([facility]RG CARE ONLY)  POCT PREGNANCY, URINE   No results found.   1. Pharyngitis    Results for orders placed during the hospital encounter of 05/14/11  POCT RAPID STREP A ([facility] CARE ONLY)      Component Value Range   Streptococcus, Group A Screen (Direct) NEGATIVE  NEGATIVE   POCT PREGNANCY, URINE      Component Value  Range   Preg Test, Ur NEGATIVE  NEGATIVE       MDM  Rapid strep negative. Treating as viral pharyngitis.  Luiz Blare, MD 05/14/11 1330

## 2011-05-14 NOTE — ED Notes (Signed)
C/O waking up with sore throat, HA, bilat earache, fever this morning.  + nausea, no vomiting.  Had one episode diarrhea w/ abd cramping this morning - has resolved thus far.  Has had productive cough x 2 wks, but is improving.  Has not had any meds this morning.

## 2011-05-15 ENCOUNTER — Telehealth (HOSPITAL_COMMUNITY): Payer: Self-pay | Admitting: *Deleted

## 2011-05-15 NOTE — ED Notes (Signed)
Pt. called on VM @ 1012 and said she does not have strep.  C/o coughing and blowing thick green drainage from her nose. She requests an antibiotic and cough medicine. 1412 Pt. called back and said she got 2 Rx.'s for pain for ST and again requests an antibiotic. I told her I would call back. Discussed with Dr. Lorenz Coaster and he said pt. needs to return for a recheck. I called pt. back and told her this. Vassie Moselle 05/15/2011

## 2011-11-07 ENCOUNTER — Inpatient Hospital Stay (HOSPITAL_COMMUNITY): Payer: Medicaid Other

## 2011-11-07 ENCOUNTER — Inpatient Hospital Stay (HOSPITAL_COMMUNITY)
Admission: AD | Admit: 2011-11-07 | Discharge: 2011-11-07 | Disposition: A | Discharge status: Home / Self Care | Payer: Medicaid Other | Source: Ambulatory Visit | Attending: Obstetrics & Gynecology | Admitting: Obstetrics & Gynecology

## 2011-11-07 ENCOUNTER — Encounter (HOSPITAL_COMMUNITY): Payer: Self-pay | Admitting: *Deleted

## 2011-11-07 DIAGNOSIS — R109 Unspecified abdominal pain: Secondary | ICD-10-CM | POA: Insufficient documentation

## 2011-11-07 DIAGNOSIS — Z331 Pregnant state, incidental: Secondary | ICD-10-CM

## 2011-11-07 DIAGNOSIS — O99891 Other specified diseases and conditions complicating pregnancy: Secondary | ICD-10-CM | POA: Insufficient documentation

## 2011-11-07 DIAGNOSIS — Z3490 Encounter for supervision of normal pregnancy, unspecified, unspecified trimester: Secondary | ICD-10-CM

## 2011-11-07 HISTORY — DX: Gastro-esophageal reflux disease without esophagitis: K21.9

## 2011-11-07 LAB — URINALYSIS, ROUTINE W REFLEX MICROSCOPIC
Ketones, ur: NEGATIVE mg/dL
Leukocytes, UA: NEGATIVE
Nitrite: NEGATIVE
Protein, ur: NEGATIVE mg/dL
Urobilinogen, UA: 0.2 mg/dL (ref 0.0–1.0)

## 2011-11-07 LAB — WET PREP, GENITAL

## 2011-11-07 LAB — HCG, QUANTITATIVE, PREGNANCY: hCG, Beta Chain, Quant, S: 3831 m[IU]/mL — ABNORMAL HIGH (ref <5)

## 2011-11-07 LAB — POCT PREGNANCY, URINE: Preg Test, Ur: POSITIVE — AB

## 2011-11-07 LAB — CBC
HCT: 36.7 % (ref 36.0–46.0)
Hemoglobin: 12 g/dL (ref 12.0–15.0)
WBC: 6.9 10*3/uL (ref 4.0–10.5)

## 2011-11-07 NOTE — MAU Provider Note (Signed)
History     CSN: [id number]  Arrival date and time: 11/07/11 1233   First Provider Initiated Contact with Patient 11/07/11 1420      Chief Complaint  Patient presents with  . Abdominal Cramping   HPI This is a 26 y.o. female at [redacted]w[redacted]d who presents with c/o pelvic pain for a week or so. Denies bleeding. States period in July was shorter than normal. Denies nausea or vomiting. Denies fever  OB History    Grav Para Term Preterm Abortions TAB SAB Ect Mult Living   2 1  1      1       Past Medical History  Diagnosis Date  . GERD (gastroesophageal reflux disease)     Past Surgical History  Procedure Date  . No past surgeries     No family history on file.  History  Substance Use Topics  . Smoking status: Current Everyday Smoker -- 0.2 packs/day  . Smokeless tobacco: Not on file  . Alcohol Use: No    Allergies:  Allergies  Allergen Reactions  . Ondansetron Hcl Other (See Comments)     Nervousness & fatigue    Prescriptions prior to admission  Medication Sig Dispense Refill  . acetaminophen (TYLENOL) 500 MG tablet Take 1,000 mg by mouth every 6 (six) hours as needed. For pain/headache      . dexlansoprazole (DEXILANT) 60 MG capsule Take 60 mg by mouth daily as needed. For acid reflux        ROS See HPI  Physical Exam   Blood pressure 139/84, pulse 80, temperature 98.2 F (36.8 C), temperature source Oral, resp. rate 18, height 5\' 8"  (1.727 m), weight 136 lb 9.6 oz (61.961 kg), last menstrual period 09/14/2011.  Physical Exam  Constitutional: She is oriented to person, place, and time. She appears well-developed and well-nourished. No distress.  Cardiovascular: Normal rate.   Respiratory: Effort normal.  GI: Soft. She exhibits no distension and no mass. There is no tenderness. There is no rebound and no guarding.  Genitourinary: Uterus normal. No vaginal discharge found.       Uterus small 5-6 week size   Musculoskeletal: Normal range of motion.    Neurological: She is alert and oriented to person, place, and time.  Skin: Skin is warm and dry.  Psychiatric: She has a normal mood and affect.    MAU Course  Procedures  MDM Will check Quant and [place] to R/O ectopic Results for orders placed during the hospital encounter of 11/07/11 (from the past 24 hour(s))  URINALYSIS, ROUTINE W REFLEX MICROSCOPIC     Status: Normal   Collection Time   11/07/11 12:35 PM      Component Value Range   Color, Urine YELLOW  YELLOW   APPearance CLEAR  CLEAR   Specific Gravity, Urine 1.020  1.005 - 1.030   pH 6.5  5.0 - 8.0   Glucose, UA NEGATIVE  NEGATIVE mg/dL   Hgb urine dipstick NEGATIVE  NEGATIVE   Bilirubin Urine NEGATIVE  NEGATIVE   Ketones, ur NEGATIVE  NEGATIVE mg/dL   Protein, ur NEGATIVE  NEGATIVE mg/dL   Urobilinogen, UA 0.2  0.0 - 1.0 mg/dL   Nitrite NEGATIVE  NEGATIVE   Leukocytes, UA NEGATIVE  NEGATIVE  POCT PREGNANCY, URINE     Status: Abnormal   Collection Time   11/07/11  1:43 PM      Component Value Range   Preg Test, Ur POSITIVE (*) NEGATIVE  WET PREP, GENITAL  Status: Abnormal   Collection Time   11/07/11  2:15 PM      Component Value Range   Yeast Wet Prep HPF POC NONE SEEN  NONE SEEN   Trich, Wet Prep NONE SEEN  NONE SEEN   Clue Cells Wet Prep HPF POC NONE SEEN  NONE SEEN   WBC, Wet Prep HPF POC FEW (*) NONE SEEN  HCG, QUANTITATIVE, PREGNANCY     Status: Abnormal   Collection Time   11/07/11  2:16 PM      Component Value Range   hCG, Beta Chain, Quant, S 3831 (*) <5 mIU/mL  CBC     Status: Normal   Collection Time   11/07/11  2:21 PM      Component Value Range   WBC 6.9  4.0 - 10.5 K/uL   RBC 4.34  3.87 - 5.11 MIL/uL   Hemoglobin 12.0  12.0 - 15.0 g/dL   HCT 78.2  95.6 - 21.3 %   MCV 84.6  78.0 - 100.0 fL   MCH 27.6  26.0 - 34.0 pg   MCHC 32.7  30.0 - 36.0 g/dL   RDW 08.6  57.8 - 46.9 %   Platelets 227  150 - 400 K/uL   US showed 5.0 week IUGS with a Yolk Sac.  Assessment and Plan  A:  Intrauterine  Pregnancy at 5.0 weeks P:  DIscharge home      Start Prenatal care  [facility] 11/07/2011, 2:57 PM

## 2011-11-07 NOTE — MAU Note (Signed)
Pt seen in clinic and pregnancy confirmed.  told to come here for u/s. Pt c/o cramping on and off.

## 2011-11-07 NOTE — MAU Provider Note (Signed)
Attestation of Attending Supervision of Advanced Practitioner (CNM/NP): Evaluation and management procedures were performed by the Advanced Practitioner under my supervision and collaboration.  I have reviewed the Advanced Practitioner's note and chart, and I agree with the management and plan.  HARRAWAY-SMITH, Anieya Helman 6:55 PM

## 2012-02-25 ENCOUNTER — Encounter (HOSPITAL_COMMUNITY): Payer: Self-pay | Admitting: Emergency Medicine

## 2012-02-25 ENCOUNTER — Emergency Department (HOSPITAL_COMMUNITY)
Admission: EM | Admit: 2012-02-25 | Discharge: 2012-02-25 | Disposition: A | Discharge status: Home / Self Care | Payer: Medicaid Other | Source: Home / Self Care

## 2012-02-25 ENCOUNTER — Other Ambulatory Visit (HOSPITAL_COMMUNITY)
Admission: RE | Admit: 2012-02-25 | Discharge: 2012-02-25 | Disposition: A | Discharge status: Home / Self Care | Payer: Medicaid Other | Source: Ambulatory Visit | Attending: Family Medicine | Admitting: Family Medicine

## 2012-02-25 DIAGNOSIS — R102 Pelvic and perineal pain: Secondary | ICD-10-CM

## 2012-02-25 DIAGNOSIS — Z113 Encounter for screening for infections with a predominantly sexual mode of transmission: Secondary | ICD-10-CM | POA: Insufficient documentation

## 2012-02-25 DIAGNOSIS — N730 Acute parametritis and pelvic cellulitis: Secondary | ICD-10-CM

## 2012-02-25 DIAGNOSIS — N760 Acute vaginitis: Secondary | ICD-10-CM | POA: Insufficient documentation

## 2012-02-25 DIAGNOSIS — Z7251 High risk heterosexual behavior: Secondary | ICD-10-CM

## 2012-02-25 LAB — POCT URINALYSIS DIP (DEVICE)
Bilirubin Urine: NEGATIVE
Glucose, UA: NEGATIVE mg/dL
Ketones, ur: NEGATIVE mg/dL
Leukocytes, UA: NEGATIVE
Nitrite: NEGATIVE
pH: 6 (ref 5.0–8.0)

## 2012-02-25 MED ORDER — LIDOCAINE HCL (PF) 1 % IJ SOLN
INTRAMUSCULAR | Status: AC
  Filled 2012-02-25: qty 5

## 2012-02-25 MED ORDER — CEFTRIAXONE SODIUM 250 MG IJ SOLR
INTRAMUSCULAR | Status: AC
  Filled 2012-02-25: qty 250

## 2012-02-25 MED ORDER — CEFTRIAXONE SODIUM 250 MG IJ SOLR
250.0000 mg | Freq: Once | INTRAMUSCULAR | Status: AC
  Administered 2012-02-25: 250 mg via INTRAMUSCULAR

## 2012-02-25 MED ORDER — AZITHROMYCIN 250 MG PO TABS
ORAL_TABLET | ORAL | Status: AC
  Filled 2012-02-25: qty 4

## 2012-02-25 MED ORDER — AZITHROMYCIN 250 MG PO TABS
1000.0000 mg | ORAL_TABLET | Freq: Every day | ORAL | Status: DC
  Administered 2012-02-25: 1000 mg via ORAL

## 2012-02-25 MED ORDER — LEVONORGESTREL 0.75 MG PO TABS: 0.7500 mg | ORAL_TABLET | Freq: Two times a day (BID) | ORAL | Status: DC

## 2012-02-25 NOTE — ED Provider Notes (Signed)
Medical screening examination/treatment/procedure(s) were performed by non-physician practitioner and as supervising physician I was immediately available for consultation/collaboration.  Raynald Blend, MD 02/25/12 1331

## 2012-02-25 NOTE — ED Provider Notes (Signed)
History     CSN: [id number]  Arrival date & time 02/25/12  1101   First MD Initiated Contact with Patient 02/25/12 1109      Chief Complaint  Patient presents with  . Abdominal Cramping    (Consider location/radiation/quality/duration/timing/severity/associated sxs/prior treatment) HPI Comments: Patient states that 2 days ago she had some vaginal bleeding intermittent for several hours. It stopped bilaterally Friday morning which would be date 2. She has also had some pelvic cramping across her low pelvis greater on the right than the left. She denies abdominal pain or symptoms. He describes the pelvic pain is sharp, the femoral and intermittent. There is pain in the pelvis which is worse with ambulation. Her last missed her period was 02/07/2012 and right on time. She has also had sexual intercourse in the past 48 hours without any protection. She is requesting Plan B written a prescription and she must have it this way for payment. She denies fever chills, abdominal pain vomiting diarrhea or other symptoms.  The history is provided by the patient.    Past Medical History  Diagnosis Date  . GERD (gastroesophageal reflux disease)     Past Surgical History  Procedure Date  . No past surgeries     No family history on file.  History  Substance Use Topics  . Smoking status: Former Smoker -- 0.2 packs/day  . Smokeless tobacco: Not on file  . Alcohol Use: Yes    OB History    Grav Para Term Preterm Abortions TAB SAB Ect Mult Living   2 1  1      1       Review of Systems  Constitutional: Negative for fever, activity change and fatigue.  HENT: Negative.   Respiratory: Negative for cough and shortness of breath.   Cardiovascular: Negative for chest pain and palpitations.  Gastrointestinal: Negative.   Genitourinary: Positive for vaginal bleeding and pelvic pain. Negative for dysuria, frequency, flank pain, decreased urine volume, vaginal discharge and difficulty  urinating.  Musculoskeletal: Negative.   Skin: Negative for color change, pallor and rash.  Neurological: Negative.     Allergies  Ondansetron hcl  Home Medications   Current Outpatient Rx  Name  Route  Sig  Dispense  Refill  . ACETAMINOPHEN 500 MG PO TABS   Oral   Take 1,000 mg by mouth every 6 (six) hours as needed. For pain/headache         . DEXLANSOPRAZOLE 60 MG PO CPDR   Oral   Take 60 mg by mouth daily as needed. For acid reflux         . LEVONORGESTREL 0.75 MG PO TABS   Oral   Take 1 tablet (0.75 mg total) by mouth every 12 (twelve) hours.   2 tablet   0     BP 135/88  Pulse 67  Temp 97.8 F (36.6 C) (Oral)  Resp 20  SpO2 98%  LMP 02/08/2012  Breastfeeding? Unknown  Physical Exam  Constitutional: She is oriented to person, place, and time. She appears well-developed and well-nourished. No distress.  HENT:  Head: Normocephalic and atraumatic.  Mouth/Throat: No oropharyngeal exudate.  Eyes: EOM are normal. Pupils are equal, round, and reactive to light.  Neck: Normal range of motion. Neck supple.  Cardiovascular: Normal rate and normal heart sounds.   Pulmonary/Chest: Effort normal and breath sounds normal. No respiratory distress.  Abdominal: Soft. She exhibits no mass. There is no tenderness. There is no rebound and no guarding.  Genitourinary:  Normal external female genitalia: A small amount of brown can pick vaginal discharges surrounding and codeine the cervix and ectocervix. Cervix is midline the ectocervix is red with an appearance of abrasions and red papules. No open lesions nor other any sources or observations of bleeding. Bimanual: Positive reproducible cervical motion tenderness with bilateral adnexal tenderness.  Musculoskeletal: Normal range of motion.  Neurological: She is alert and oriented to person, place, and time. No cranial nerve deficit.  Skin: Skin is warm and dry.  Psychiatric: She has a normal mood and affect.    ED  Course  Procedures (including critical care time)  Labs Reviewed  POCT URINALYSIS DIP (DEVICE) - Abnormal; Notable for the following:    Hgb urine dipstick TRACE (*)     All other components within normal limits  POCT PREGNANCY, URINE  CERVICOVAGINAL ANCILLARY ONLY   No results found.   1. PID (acute pelvic inflammatory disease)   2. Pelvic pain   3. High risk sexual behavior       MDM  Pregnancy test is negative and urine is normal. History physical system with PID. She also had intercourse last night having unprotected sex and is wishing to have Plan B. Rocephin 250 mg IM now Azithromycin 1 g by mouth now Obtained a firm and GC Chlamydia test we will treat in accordance with results. [facility] is recommended to [facility] to [facility].          Hayden Rasmussen, NP 02/25/12 [phone]

## 2012-02-25 NOTE — ED Notes (Signed)
Provided gingerale/ice/peanut butter and graham crackers

## 2012-02-25 NOTE — ED Notes (Signed)
Patient aware of 15 minute delay post injection to assure no adverse response to medicine.

## 2012-02-25 NOTE — ED Notes (Signed)
Patient reports vaginal spotting, abdominal cramping that started Thursday, no bleeding since Friday.  Patient continues to have cramping.  Reports having sharp pain in low right last night.  Patient reports having been treated for uti "a few weeks ago" not clear what antibiotic used.  Patient also reports she treated a yeast infection with an over the counter remedy while taking antibiotics.

## 2012-04-02 ENCOUNTER — Emergency Department (INDEPENDENT_AMBULATORY_CARE_PROVIDER_SITE_OTHER)
Admission: EM | Admit: 2012-04-02 | Discharge: 2012-04-02 | Disposition: A | Discharge status: Home / Self Care | Payer: Self-pay | Source: Home / Self Care | Attending: Emergency Medicine | Admitting: Emergency Medicine

## 2012-04-02 ENCOUNTER — Emergency Department (INDEPENDENT_AMBULATORY_CARE_PROVIDER_SITE_OTHER): Payer: Self-pay

## 2012-04-02 ENCOUNTER — Encounter (HOSPITAL_COMMUNITY): Payer: Self-pay | Admitting: *Deleted

## 2012-04-02 DIAGNOSIS — J111 Influenza due to unidentified influenza virus with other respiratory manifestations: Secondary | ICD-10-CM

## 2012-04-02 MED ORDER — PROMETHAZINE HCL 25 MG PO TABS: 25.0000 mg | ORAL_TABLET | Freq: Four times a day (QID) | ORAL | Status: DC | PRN

## 2012-04-02 MED ORDER — OSELTAMIVIR PHOSPHATE 75 MG PO CAPS: 75.0000 mg | ORAL_CAPSULE | Freq: Two times a day (BID) | ORAL | Status: DC

## 2012-04-02 MED ORDER — TRAMADOL HCL 50 MG PO TABS: 100.0000 mg | ORAL_TABLET | Freq: Three times a day (TID) | ORAL | Status: DC | PRN

## 2012-04-02 MED ORDER — GUAIFENESIN-CODEINE 100-10 MG/5ML PO SYRP: 10.0000 mL | ORAL_SOLUTION | Freq: Four times a day (QID) | ORAL | Status: DC | PRN

## 2012-04-02 NOTE — ED Provider Notes (Signed)
Chief Complaint  Patient presents with  . Cough  . Fever  . Nausea    History of Present Illness:   Jodi Cochran  is a 27 year old female who has had a one-day history of cough productive of clear sputum, rattling in chest, aching chest, generalized weakness, nausea, fever, chills, nasal congestion, headache, sore throat, abdominal pain, nausea, and diarrhea. She denies any wheezing. She's not been exposed to anything in particular. She has not tried any medication for symptom relief.  Review of Systems:  Other than noted above, the patient denies any of the following symptoms. Systemic:  No fever, chills, sweats, fatigue, myalgias, headache, or anorexia. Eye:  No redness, pain or drainage. ENT:  No earache, ear congestion, nasal congestion, sneezing, rhinorrhea, sinus pressure, sinus pain, post nasal drip, or sore throat. Lungs:  No cough, sputum production, wheezing, shortness of breath, or chest pain. GI:  No abdominal pain, nausea, vomiting, or diarrhea.  PMFSH:  Past medical history, family history, social history, meds, and allergies were reviewed.  Physical Exam:   Vital signs:  BP 135/84  Pulse 100  Temp 101.9 F (38.8 C) (Oral)  Resp 18  SpO2 100%  LMP 03/31/2012 General:  Alert, in no distress. Eye:  No conjunctival injection or drainage. Lids were normal. ENT:  TMs and canals were normal, without erythema or inflammation.  Nasal mucosa was clear and uncongested, without drainage.  Mucous membranes were moist.  Pharynx was clear, without exudate or drainage.  There were no oral ulcerations or lesions. Neck:  Supple, no adenopathy, tenderness or mass. Lungs:  No respiratory distress.  Lungs were clear to auscultation, without wheezes, rales or rhonchi.  Breath sounds were clear and equal bilaterally.  Heart:  Regular rhythm, without gallops, murmers or rubs. Skin:  Clear, warm, and dry, without rash or lesions.  Radiology:  Dg Chest 2 View  04/02/2012  *RADIOLOGY REPORT*   Clinical Data: Cough and fever  CHEST - 2 VIEW  Comparison: Lungs clear.  The heart size and pulmonary vascularity are normal.  No adenopathy.  No bone lesions.  IMPRESSION: No abnormality noted.   Original Report Authenticated By: Bretta Bang, M.D.    I reviewed the images independently and personally and concur with the radiologist's findings.  Assessment:  The encounter diagnosis was Influenza-like illness.  Plan:   1.  The following meds were prescribed:   New Prescriptions   GUAIFENESIN-CODEINE (GUIATUSS AC) 100-10 MG/5ML SYRUP    Take 10 mLs by mouth 4 (four) times daily as needed for cough.   OSELTAMIVIR (TAMIFLU) 75 MG CAPSULE    Take 1 capsule (75 mg total) by mouth every 12 (twelve) hours.   PROMETHAZINE (PHENERGAN) 25 MG TABLET    Take 1 tablet (25 mg total) by mouth every 6 (six) hours as needed for nausea.   TRAMADOL (ULTRAM) 50 MG TABLET    Take 2 tablets (100 mg total) by mouth every 8 (eight) hours as needed for pain.   2.  The patient was instructed in symptomatic care and handouts were given. 3.  The patient was told to return if becoming worse in any way, if no better in 3 or 4 days, and given some red flag symptoms that would indicate earlier return.   Reuben Likes, MD 04/02/12 Barry Brunner

## 2012-04-02 NOTE — ED Notes (Signed)
Pt is here with  Complaint of dry cough with onset last night.  Pt reports temp this am of 100.4 with chest soreness associated with coughing.  Pt also reports generalized malaise and nausea.

## 2012-05-28 ENCOUNTER — Emergency Department (INDEPENDENT_AMBULATORY_CARE_PROVIDER_SITE_OTHER)
Admission: EM | Admit: 2012-05-28 | Discharge: 2012-05-28 | Disposition: A | Discharge status: Home / Self Care | Payer: Self-pay | Source: Home / Self Care

## 2012-05-28 ENCOUNTER — Encounter (HOSPITAL_COMMUNITY): Payer: Self-pay | Admitting: *Deleted

## 2012-05-28 DIAGNOSIS — S90569A Insect bite (nonvenomous), unspecified ankle, initial encounter: Secondary | ICD-10-CM

## 2012-05-28 MED ORDER — TRIAMCINOLONE ACETONIDE 0.5 % EX CREA: TOPICAL_CREAM | Freq: Three times a day (TID) | CUTANEOUS | Status: DC

## 2012-05-28 MED ORDER — CEPHALEXIN 500 MG PO CAPS: 500.0000 mg | ORAL_CAPSULE | Freq: Four times a day (QID) | ORAL | Status: DC

## 2012-05-28 NOTE — ED Notes (Signed)
Pt reports possible insect bite on upper right outer leg. Redness, tender to touch with black dot area in middle.

## 2012-05-28 NOTE — ED Provider Notes (Signed)
History     CSN: [id number]  Arrival date & time 05/28/12  1114   None     Chief Complaint  Patient presents with  . Insect Bite    (Consider location/radiation/quality/duration/timing/severity/associated sxs/prior treatment) HPI Comments: 27 year old female presents with what she believes to be an insect bite that occurred around Friday night 4 days ago. He started out with a very small bump that was itchy. It is since increased in size now for it is approximately 2 and half centimeters in diameter. There is minor cutaneous erythema with a central pinpoint Speck of darkness. She denies constitutional symptoms   Past Medical History  Diagnosis Date  . GERD (gastroesophageal reflux disease)     Past Surgical History  Procedure Laterality Date  . No past surgeries      History reviewed. No pertinent family history.  History  Substance Use Topics  . Smoking status: Former Smoker -- 0.25 packs/day  . Smokeless tobacco: Not on file  . Alcohol Use: Yes    OB History   Grav Para Term Preterm Abortions TAB SAB Ect Mult Living   2 1  1      1       Review of Systems  Constitutional: Negative.   Respiratory: Negative.   Skin: Negative for rash.       See history of present illness  Neurological: Negative.   Psychiatric/Behavioral: Negative.     Allergies  Ondansetron hcl  Home Medications   Current Outpatient Rx  Name  Route  Sig  Dispense  Refill  . acetaminophen (TYLENOL) 500 MG tablet   Oral   Take 1,000 mg by mouth every 6 (six) hours as needed. For pain/headache         . cephALEXin (KEFLEX) 500 MG capsule   Oral   Take 1 capsule (500 mg total) by mouth 4 (four) times daily.   28 capsule   0   . dexlansoprazole (DEXILANT) 60 MG capsule   Oral   Take 60 mg by mouth daily as needed. For acid reflux         . guaiFENesin-codeine (GUIATUSS AC) 100-10 MG/5ML syrup   Oral   Take 10 mLs by mouth 4 (four) times daily as needed for cough.   120 mL    0   . levonorgestrel (PLAN B) 0.75 MG tablet   Oral   Take 1 tablet (0.75 mg total) by mouth every 12 (twelve) hours.   2 tablet   0   . oseltamivir (TAMIFLU) 75 MG capsule   Oral   Take 1 capsule (75 mg total) by mouth every 12 (twelve) hours.   10 capsule   0   . promethazine (PHENERGAN) 25 MG tablet   Oral   Take 1 tablet (25 mg total) by mouth every 6 (six) hours as needed for nausea.   30 tablet   0   . traMADol (ULTRAM) 50 MG tablet   Oral   Take 2 tablets (100 mg total) by mouth every 8 (eight) hours as needed for pain.   30 tablet   0   . triamcinolone cream (KENALOG) 0.5 %   Topical   Apply topically 3 (three) times daily.   15 g   0   . triamcinolone cream (KENALOG) 0.5 %   Topical   Apply topically 3 (three) times daily.   15 g   0     BP 126/80  Pulse 74  Temp(Src) 98.3 F (36.8 C) (Oral)  Resp 18  SpO2 99%  LMP 09/14/2011  Physical Exam  Nursing note reviewed. Constitutional: She is oriented to person, place, and time. She appears well-developed and well-nourished.  Neck: Neck supple.  Pulmonary/Chest: Effort normal.  Neurological: She is alert and oriented to person, place, and time.  Skin: Skin is warm and dry. No rash noted.   as per history of present illness. No area of slight cutaneous approximately a 2 and half centimeters. superficial area of induration approximately 1/2 cm under a central pinpoint.suspected of being the site of the bite. Lymphangitis.   Psychiatric: She has a normal mood and affect.    ED Course  Procedures (including critical care time)  Labs Reviewed - No data to display No results found.   1. Insect bite of thigh, right, initial encounter       MDM  Do not take the Keflex antibiotic if getting better. If worse, more pain, getting larger or increased redness start taking it. Otherwise apply the triamcinolone cream tid prn.          Hayden Rasmussen, NP 05/28/12 1154

## 2012-05-29 NOTE — ED Provider Notes (Signed)
Medical screening examination/treatment/procedure(s) were performed by resident physician or non-physician practitioner and as supervising physician I was immediately available for consultation/collaboration.   Barkley Bruns MD.   Linna Hoff, MD 05/29/12 2003

## 2012-05-30 ENCOUNTER — Encounter (HOSPITAL_COMMUNITY): Payer: Self-pay | Admitting: Emergency Medicine

## 2012-05-30 ENCOUNTER — Emergency Department (INDEPENDENT_AMBULATORY_CARE_PROVIDER_SITE_OTHER)
Admission: EM | Admit: 2012-05-30 | Discharge: 2012-05-30 | Disposition: A | Discharge status: Home / Self Care | Payer: Self-pay | Source: Home / Self Care | Attending: Emergency Medicine | Admitting: Emergency Medicine

## 2012-05-30 DIAGNOSIS — L0390 Cellulitis, unspecified: Secondary | ICD-10-CM

## 2012-05-30 DIAGNOSIS — L0291 Cutaneous abscess, unspecified: Secondary | ICD-10-CM

## 2012-05-30 MED ORDER — SULFAMETHOXAZOLE-TMP DS 800-160 MG PO TABS: 2.0000 | ORAL_TABLET | Freq: Two times a day (BID) | ORAL | Status: DC

## 2012-05-30 MED ORDER — MUPIROCIN 2 % EX OINT: TOPICAL_OINTMENT | Freq: Three times a day (TID) | CUTANEOUS | Status: DC

## 2012-05-30 MED ORDER — CHLORHEXIDINE GLUCONATE 4 % EX LIQD: 60.0000 mL | Freq: Every day | CUTANEOUS | Status: DC | PRN

## 2012-05-30 NOTE — ED Notes (Signed)
Waiting discharge papers 

## 2012-05-30 NOTE — ED Notes (Signed)
Pt states that she was seen here on 3/18 for bite on leg. Pt states that the area is still sore and tender. Pt was seen by primary doctor today and was told that it was a staph infection. Pt has concerns and wants testing for staph infection.

## 2012-05-30 NOTE — ED Provider Notes (Signed)
Chief Complaint:   Chief Complaint  Patient presents with  . Recurrent Skin Infections    seen on 3/18. still having pain and tenderness. told by primary staph infection. wants testing done    History of Present Illness:   Jodi Cochran is a 27 year old female who was seen here on March 18, 2 days ago with a red bump on her right lateral thigh. This was felt to be an insect bite and she was given steroid cream and cephalexin. The redness and swelling is getting worse, is more painful, there's been no drainage, and she has no fever. Her daughter is being treated for MRSA.  Review of Systems:  Other than noted above, the patient denies any of the following symptoms: Systemic:  No fever, chills, sweats, weight loss, or fatigue. ENT:  No nasal congestion, rhinorrhea, sore throat, swelling of lips, tongue or throat. Resp:  No cough, wheezing, or shortness of breath. Skin:  No rash, itching, nodules, or suspicious lesions.  PMFSH:  Past medical history, family history, social history, meds, and allergies were reviewed.  Physical Exam:   Vital signs:  BP 143/88  Pulse 80  Temp(Src) 97.5 F (36.4 C) (Oral)  Resp 12  SpO2 97%  LMP 09/14/2011 Gen:  Alert, oriented, in no distress. ENT:  Pharynx clear, no intraoral lesions, moist mucous membranes. Lungs:  Clear to auscultation. Skin:  There is a raised, red, 5 cm area of erythema and tenderness to palpation. There was no purulent drainage or fluctuance. Skin was otherwise clear.  Assessment:  The encounter diagnosis was Cellulitis.  Probably due to MRSA, although I can't prove it since it is nothing to culture.  Plan:   1.  The following meds were prescribed:   Discharge Medication List as of 05/30/2012  6:09 PM    START taking these medications   Details  chlorhexidine (HIBICLENS) 4 % external liquid Apply 60 mLs (4 application total) topically daily as needed., Starting 05/30/2012, Until Discontinued, Normal    mupirocin ointment  (BACTROBAN) 2 % Apply topically 3 (three) times daily., Starting 05/30/2012, Until Discontinued, Normal    sulfamethoxazole-trimethoprim (BACTRIM DS) 800-160 MG per tablet Take 2 tablets by mouth 2 (two) times daily., Starting 05/30/2012, Until Discontinued, Normal       2.  The patient was instructed in symptomatic care and handouts were given. 3.  The patient was told to return if becoming worse in any way, if no better in 3 or 4 days, and given some red flag symptoms such as fever, worsening pain, or spreading erythema that would indicate earlier return.     Reuben Likes, MD 05/30/12 2102

## 2012-06-04 ENCOUNTER — Telehealth (HOSPITAL_COMMUNITY): Payer: Self-pay | Admitting: *Deleted

## 2012-06-04 NOTE — ED Notes (Signed)
Pt. called on VM and said she is taking Septra DS and Ibuprofen.  C/o diarrhea, then constipation and nausea.  She has not taken the Septra since yesterday and wants to know if she can stop it or does she need to get another Rx.  I called pt. back and she said started having diarrhea first for 2 days.  She stopped the Ibuprofen.  Then she got constipated and nauseated.  She tried a Fleet enema without relief.  I told her to she may not have any stool since she had the diarrhea and then the nausea.  I told her she needs to keep taking the antibiotic because the doctor suspected MRSA.  Try taking the Septra with food.  If no BM tomorrow, she can try Miralax 17 gm /day.  Call if any vomiting or worsening of her symptoms. If no improvement in 2 days call back. Vassie Moselle 06/04/2012

## 2012-06-08 ENCOUNTER — Emergency Department (HOSPITAL_COMMUNITY)
Admission: EM | Admit: 2012-06-08 | Discharge: 2012-06-08 | Disposition: A | Discharge status: Home / Self Care | Payer: Self-pay | Attending: Emergency Medicine | Admitting: Emergency Medicine

## 2012-06-08 ENCOUNTER — Encounter (HOSPITAL_COMMUNITY): Payer: Self-pay | Admitting: Emergency Medicine

## 2012-06-08 DIAGNOSIS — K0889 Other specified disorders of teeth and supporting structures: Secondary | ICD-10-CM

## 2012-06-08 DIAGNOSIS — Z87891 Personal history of nicotine dependence: Secondary | ICD-10-CM | POA: Insufficient documentation

## 2012-06-08 DIAGNOSIS — K089 Disorder of teeth and supporting structures, unspecified: Secondary | ICD-10-CM | POA: Insufficient documentation

## 2012-06-08 DIAGNOSIS — Z8719 Personal history of other diseases of the digestive system: Secondary | ICD-10-CM | POA: Insufficient documentation

## 2012-06-08 MED ORDER — HYDROCODONE-ACETAMINOPHEN 5-325 MG PO TABS: 2.0000 | ORAL_TABLET | ORAL | Status: DC | PRN

## 2012-06-08 NOTE — ED Notes (Signed)
PT. REPORTS PERSISTENT RIGHT UPPER / LOWER MOLAR PAIN FOR SEVERAL WEEKS UNRELIEVED BY OTC PAIN MEDICATIONS.

## 2012-06-08 NOTE — ED Provider Notes (Signed)
History     CSN: [id number]  Arrival date & time 06/08/12  0400   First MD Initiated Contact with Patient 06/08/12 0444      Chief Complaint  Patient presents with  . Dental Pain    (Consider location/radiation/quality/duration/timing/severity/associated sxs/prior treatment) HPI 27 year old female presents to emergency room complaining of intermittent right upper and lower dental pain ongoing for last 2-3 weeks.  Patient reports during the day.  She is able to tolerate pain with taking Tylenol.  The pain is worse at night, and radiates into her right ear.  Tonight she took 800 mg of ibuprofen without improvement.  She denies any fever.  No swelling or drainage.  Patient does not have a dentist. Past Medical History  Diagnosis Date  . GERD (gastroesophageal reflux disease)     Past Surgical History  Procedure Laterality Date  . No past surgeries      No family history on file.  History  Substance Use Topics  . Smoking status: Former Smoker -- 0.25 packs/day  . Smokeless tobacco: Not on file  . Alcohol Use: Yes    OB History   Grav Para Term Preterm Abortions TAB SAB Ect Mult Living   2 1  1      1       Review of Systems  All other systems reviewed and are negative.    Allergies  Ondansetron hcl  Home Medications   Current Outpatient Rx  Name  Route  Sig  Dispense  Refill  . ibuprofen (ADVIL,MOTRIN) 200 MG tablet   Oral   Take 200 mg by mouth every 6 (six) hours as needed for pain.         . mupirocin ointment (BACTROBAN) 2 %   Topical   Apply topically 3 (three) times daily.   22 g   2   . sulfamethoxazole-trimethoprim (BACTRIM DS) 800-160 MG per tablet   Oral   Take 2 tablets by mouth 2 (two) times daily.   40 tablet   0   . cephALEXin (KEFLEX) 500 MG capsule   Oral   Take 1 capsule (500 mg total) by mouth 4 (four) times daily.   28 capsule   0   . HYDROcodone-acetaminophen (NORCO/VICODIN) 5-325 MG per tablet   Oral   Take 2 tablets by  mouth every 4 (four) hours as needed for pain.   16 tablet   0     BP 129/82  Pulse 80  Temp(Src) 98.3 F (36.8 C) (Oral)  Resp 16  SpO2 100%  LMP 09/14/2011  Physical Exam  Nursing note and vitals reviewed. Constitutional: She appears well-developed and well-nourished. No distress.  HENT:  Head: Normocephalic and atraumatic.  Right Ear: External ear normal.  Left Ear: External ear normal.  Nose: Nose normal.  Mouth/Throat: Oropharynx is clear and moist. No oropharyngeal exudate.  No signs of dental decay.  No tenderness with tapping of teeth.  No swelling or gingival tenderness with palpation  Eyes: Conjunctivae and EOM are normal. Pupils are equal, round, and reactive to light.  Neck: Normal range of motion. Neck supple. No JVD present. No tracheal deviation present. No thyromegaly present.  Pulmonary/Chest: No stridor.  Lymphadenopathy:    She has no cervical adenopathy.  Skin: Skin is warm and dry. No rash noted. She is not diaphoretic. No erythema. No pallor.    ED Course  Procedures (including critical care time)  Labs Reviewed - No data to display No results found.   1.  Pain, dental       MDM  27 year old female with dental pain.  Patient may be having her wisdom teeth grow in, no active infection or decay seen.  Non-today's exam.  We'll refer patient to a dentist for further evaluation.  No signs of sinus disease or ear infection at this time       Olivia Mackie, MD 06/08/12 [phone]

## 2012-06-08 NOTE — ED Notes (Signed)
Pt. states that she has been having dental pain on the right side of her mouth. States it is worse at night. She does not have a dentist she says because she doesn't have insurance.

## 2012-07-11 ENCOUNTER — Encounter (HOSPITAL_COMMUNITY): Payer: Self-pay | Admitting: Emergency Medicine

## 2012-07-11 ENCOUNTER — Emergency Department (HOSPITAL_COMMUNITY)
Admission: EM | Admit: 2012-07-11 | Discharge: 2012-07-11 | Disposition: A | Discharge status: Home / Self Care | Payer: Self-pay | Attending: Emergency Medicine | Admitting: Emergency Medicine

## 2012-07-11 ENCOUNTER — Telehealth (HOSPITAL_COMMUNITY): Payer: Self-pay | Admitting: Emergency Medicine

## 2012-07-11 DIAGNOSIS — N926 Irregular menstruation, unspecified: Secondary | ICD-10-CM | POA: Insufficient documentation

## 2012-07-11 DIAGNOSIS — Z3202 Encounter for pregnancy test, result negative: Secondary | ICD-10-CM | POA: Insufficient documentation

## 2012-07-11 DIAGNOSIS — N898 Other specified noninflammatory disorders of vagina: Secondary | ICD-10-CM | POA: Insufficient documentation

## 2012-07-11 DIAGNOSIS — Z8719 Personal history of other diseases of the digestive system: Secondary | ICD-10-CM | POA: Insufficient documentation

## 2012-07-11 DIAGNOSIS — Z87891 Personal history of nicotine dependence: Secondary | ICD-10-CM | POA: Insufficient documentation

## 2012-07-11 DIAGNOSIS — R109 Unspecified abdominal pain: Secondary | ICD-10-CM | POA: Insufficient documentation

## 2012-07-11 LAB — PREGNANCY, URINE: Preg Test, Ur: NEGATIVE

## 2012-07-11 LAB — WET PREP, GENITAL
Trich, Wet Prep: NONE SEEN
Yeast Wet Prep HPF POC: NONE SEEN

## 2012-07-11 LAB — POCT I-STAT, CHEM 8
Calcium, Ion: 1.28 mmol/L — ABNORMAL HIGH (ref 1.12–1.23)
Chloride: 106 mEq/L (ref 96–112)
Creatinine, Ser: 0.7 mg/dL (ref 0.50–1.10)
Glucose, Bld: 83 mg/dL (ref 70–99)
HCT: 41 % (ref 36.0–46.0)
Hemoglobin: 13.9 g/dL (ref 12.0–15.0)
Potassium: 3.9 mEq/L (ref 3.5–5.1)

## 2012-07-11 LAB — URINALYSIS, ROUTINE W REFLEX MICROSCOPIC
Bilirubin Urine: NEGATIVE
Hgb urine dipstick: NEGATIVE
Specific Gravity, Urine: 1.022 (ref 1.005–1.030)
Urobilinogen, UA: 0.2 mg/dL (ref 0.0–1.0)
pH: 7 (ref 5.0–8.0)

## 2012-07-11 MED ORDER — TRAMADOL HCL 50 MG PO TABS: 50.0000 mg | ORAL_TABLET | Freq: Four times a day (QID) | ORAL | Status: DC | PRN

## 2012-07-11 NOTE — ED Notes (Signed)
Pt reports LMP 06/14/12 started bleeding again on Tuesday 07/09/12. After the LMP on 06/14/12 pt reports vag discharge and cramping around middle of April. Reports it is not time for her period. NAD

## 2012-07-11 NOTE — ED Notes (Signed)
PA at bedside performing pelvic exam

## 2012-07-11 NOTE — ED Notes (Signed)
Urine sent to lab 

## 2012-07-12 LAB — GC/CHLAMYDIA PROBE AMP: CT Probe RNA: NEGATIVE

## 2012-07-13 ENCOUNTER — Telehealth (HOSPITAL_COMMUNITY): Payer: Self-pay | Admitting: Emergency Medicine

## 2012-07-16 NOTE — ED Provider Notes (Signed)
Medical screening examination/treatment/procedure(s) were performed by non-physician practitioner and as supervising physician I was immediately available for consultation/collaboration.   Jamichael Knotts, MD 07/16/12 1539 

## 2012-07-16 NOTE — ED Provider Notes (Signed)
History     CSN: [id number]  Arrival date & time 07/11/12  4098   First MD Initiated Contact with Patient 07/11/12 0901      Chief Complaint  Patient presents with  . Vaginal Bleeding    (Consider location/radiation/quality/duration/timing/severity/associated sxs/prior treatment) Patient is a 27 y.o. female presenting with vaginal bleeding. The history is provided by the patient. No language interpreter was used.  Vaginal Bleeding This is a new problem. The current episode started in the past 7 days. Pertinent negatives include no chills. Associated symptoms comments: She reports irregular vaginal bleeding and vaginal discharge. Menses x 2 in April. She is having some lower abdominal cramping as well, without severe pain..    Past Medical History  Diagnosis Date  . GERD (gastroesophageal reflux disease)     Past Surgical History  Procedure Laterality Date  . No past surgeries      No family history on file.  History  Substance Use Topics  . Smoking status: Former Smoker -- 0.25 packs/day  . Smokeless tobacco: Not on file  . Alcohol Use: Yes    OB History   Grav Para Term Preterm Abortions TAB SAB Ect Mult Living   2 1  1      1       Review of Systems  Constitutional: Negative for chills.  Respiratory: Negative.  Negative for shortness of breath.   Gastrointestinal: Negative.   Genitourinary: Positive for vaginal bleeding and vaginal discharge. Negative for dysuria.  Musculoskeletal: Negative.   Skin: Negative.   Neurological: Negative.  Negative for light-headedness.    Allergies  Ondansetron hcl  Home Medications   Current Outpatient Rx  Name  Route  Sig  Dispense  Refill  . HYDROcodone-acetaminophen (NORCO/VICODIN) 5-325 MG per tablet   Oral   Take 2 tablets by mouth every 4 (four) hours as needed for pain.         Marland Kitchen ibuprofen (ADVIL,MOTRIN) 800 MG tablet   Oral   Take 800 mg by mouth every 8 (eight) hours as needed for pain.         .  traMADol (ULTRAM) 50 MG tablet   Oral   Take 1 tablet (50 mg total) by mouth every 6 (six) hours as needed for pain.   15 tablet   0     BP 135/86  Pulse 74  Temp(Src) 98.8 F (37.1 C) (Oral)  Resp 16  Ht 5\' 8"  (1.727 m)  Wt 140 lb (63.504 kg)  BMI 21.29 kg/m2  SpO2 100%  LMP 09/14/2011  Physical Exam  Constitutional: She is oriented to person, place, and time. She appears well-developed and well-nourished.  HENT:  Head: Normocephalic.  Neck: Normal range of motion. Neck supple.  Cardiovascular: Normal rate and regular rhythm.   Pulmonary/Chest: Effort normal and breath sounds normal.  Abdominal: Soft. Bowel sounds are normal. There is no tenderness. There is no rebound and no guarding.  Genitourinary:  Moderate cervical bleeding without discharge from cervix. No CMT, adnexal tenderness or mass.  Musculoskeletal: Normal range of motion.  Neurological: She is alert and oriented to person, place, and time.  Skin: Skin is warm and dry. No rash noted.  Psychiatric: She has a normal mood and affect.    ED Course  Procedures (including critical care time)  Labs Reviewed  WET PREP, GENITAL - Abnormal; Notable for the following:    WBC, Wet Prep HPF POC FEW (*)    All other components within normal limits  POCT  I-STAT, CHEM 8 - Abnormal; Notable for the following:    Calcium, Ion 1.28 (*)    All other components within normal limits  GC/CHLAMYDIA PROBE AMP  URINALYSIS, ROUTINE W REFLEX MICROSCOPIC  PREGNANCY, URINE   No results found.   1. Irregular menses       MDM  Stable exam, normal hemoglobin and negative pregnancy test. She is stable for discharge and encouraged to follow up with her doctor.       Arnoldo Hooker, PA-C 07/16/12 1059

## 2012-07-30 ENCOUNTER — Inpatient Hospital Stay (HOSPITAL_COMMUNITY)
Admission: AD | Admit: 2012-07-30 | Discharge: 2012-07-30 | Discharge status: Against Medical Advice | Payer: Self-pay | Source: Ambulatory Visit | Attending: Family Medicine | Admitting: Family Medicine

## 2012-07-30 ENCOUNTER — Emergency Department (INDEPENDENT_AMBULATORY_CARE_PROVIDER_SITE_OTHER)
Admission: EM | Admit: 2012-07-30 | Discharge: 2012-07-30 | Disposition: A | Discharge status: Home / Self Care | Payer: Self-pay | Source: Home / Self Care

## 2012-07-30 ENCOUNTER — Encounter (HOSPITAL_COMMUNITY): Payer: Self-pay | Admitting: Emergency Medicine

## 2012-07-30 DIAGNOSIS — N949 Unspecified condition associated with female genital organs and menstrual cycle: Secondary | ICD-10-CM

## 2012-07-30 DIAGNOSIS — R102 Pelvic and perineal pain: Secondary | ICD-10-CM

## 2012-07-30 LAB — POCT URINALYSIS DIP (DEVICE)
Ketones, ur: NEGATIVE mg/dL
Protein, ur: NEGATIVE mg/dL
Specific Gravity, Urine: 1.025 (ref 1.005–1.030)
Urobilinogen, UA: 0.2 mg/dL (ref 0.0–1.0)
pH: 7 (ref 5.0–8.0)

## 2012-07-30 NOTE — MAU Note (Signed)
Pt not in lobby.  

## 2012-07-30 NOTE — ED Notes (Signed)
Pt c/o of abdominal cramping x 2-3 weeks. Last menstrual period was April 3rd and was irregular, heavy cramps and spotting. Was seen at [facility] [place] and told she had abnormal uterine bleeding. No menstrual period since then. Has been taking Ibuprofen 800 mg with mild relief of symptoms. Has mild discharge. Feels nauseous. Has had diarrhea but now has subsided and bowel movements are normal. Patient is alert and oriented.

## 2012-07-30 NOTE — MAU Note (Signed)
Pt not in lobby, third call 

## 2012-07-30 NOTE — ED Provider Notes (Signed)
History     CSN: [id number]  Arrival date & time 07/30/12  1333   None     Chief Complaint  Patient presents with  . Abdominal Cramping    (Consider location/radiation/quality/duration/timing/severity/associated sxs/prior treatment) Patient is a 27 y.o. female presenting with cramps. The history is provided by the patient.  Abdominal Cramping This is a new problem. The current episode started more than 1 week ago (3 weeks of nausea, abd cramping and diarrhea. onset after end of menses 5/3, had been seen 5/1 in ER). The problem occurs constantly. The problem has been gradually improving. Associated symptoms include abdominal pain. Nothing aggravates the symptoms.    Past Medical History  Diagnosis Date  . GERD (gastroesophageal reflux disease)     Past Surgical History  Procedure Laterality Date  . No past surgeries      No family history on file.  History  Substance Use Topics  . Smoking status: Former Smoker -- 0.25 packs/day  . Smokeless tobacco: Not on file  . Alcohol Use: Yes    OB History   Grav Para Term Preterm Abortions TAB SAB Ect Mult Living   2 1  1      1       Review of Systems  Constitutional: Negative.   Gastrointestinal: Positive for nausea, abdominal pain and diarrhea. Negative for vomiting and constipation.  Genitourinary: Positive for menstrual problem and pelvic pain. Negative for vaginal bleeding.    Allergies  Ondansetron hcl  Home Medications   Current Outpatient Rx  Name  Route  Sig  Dispense  Refill  . ibuprofen (ADVIL,MOTRIN) 800 MG tablet   Oral   Take 800 mg by mouth every 8 (eight) hours as needed for pain.         Marland Kitchen HYDROcodone-acetaminophen (NORCO/VICODIN) 5-325 MG per tablet   Oral   Take 2 tablets by mouth every 4 (four) hours as needed for pain.         . traMADol (ULTRAM) 50 MG tablet   Oral   Take 1 tablet (50 mg total) by mouth every 6 (six) hours as needed for pain.   15 tablet   0     BP 98/2  Pulse  58  Temp(Src) 98.2 F (36.8 C) (Oral)  Resp 16  SpO2 100%  LMP 06/13/2012  Physical Exam  Nursing note and vitals reviewed. Constitutional: She appears well-developed and well-nourished. No distress.  Pulmonary/Chest: Breath sounds normal.  Abdominal: Soft. Bowel sounds are normal. She exhibits no distension and no mass. There is no hepatosplenomegaly. There is tenderness in the suprapubic area. There is no rebound, no guarding and no CVA tenderness.    ED Course  Procedures (including critical care time)  Labs Reviewed  POCT URINALYSIS DIP (DEVICE) - Abnormal; Notable for the following:    Leukocytes, UA LARGE (*)    All other components within normal limits  POCT PREGNANCY, URINE   No results found.   1. Pelvic pain in female       MDM          Linna Hoff, MD 07/30/12 1558

## 2013-01-16 ENCOUNTER — Other Ambulatory Visit: Payer: Self-pay

## 2013-03-28 ENCOUNTER — Encounter (HOSPITAL_COMMUNITY): Payer: Self-pay | Admitting: Emergency Medicine

## 2013-03-28 ENCOUNTER — Emergency Department (INDEPENDENT_AMBULATORY_CARE_PROVIDER_SITE_OTHER)
Admission: EM | Admit: 2013-03-28 | Discharge: 2013-03-28 | Disposition: A | Discharge status: Home / Self Care | Payer: Self-pay | Source: Home / Self Care | Attending: Family Medicine | Admitting: Family Medicine

## 2013-03-28 DIAGNOSIS — B349 Viral infection, unspecified: Secondary | ICD-10-CM

## 2013-03-28 DIAGNOSIS — B9789 Other viral agents as the cause of diseases classified elsewhere: Secondary | ICD-10-CM

## 2013-03-28 LAB — POCT URINALYSIS DIP (DEVICE)
BILIRUBIN URINE: NEGATIVE
GLUCOSE, UA: NEGATIVE mg/dL
HGB URINE DIPSTICK: NEGATIVE
Ketones, ur: NEGATIVE mg/dL
LEUKOCYTES UA: NEGATIVE
NITRITE: NEGATIVE
Protein, ur: NEGATIVE mg/dL
Specific Gravity, Urine: 1.025 (ref 1.005–1.030)
Urobilinogen, UA: 0.2 mg/dL (ref 0.0–1.0)
pH: 6.5 (ref 5.0–8.0)

## 2013-03-28 LAB — POCT PREGNANCY, URINE: PREG TEST UR: NEGATIVE

## 2013-03-28 MED ORDER — IPRATROPIUM BROMIDE 0.06 % NA SOLN: 2.0000 | Freq: Four times a day (QID) | NASAL | Status: DC

## 2013-03-28 MED ORDER — PROMETHAZINE HCL 25 MG PO TABS: 25.0000 mg | ORAL_TABLET | Freq: Four times a day (QID) | ORAL | Status: DC | PRN

## 2013-03-28 NOTE — ED Provider Notes (Signed)
Jodi DockerLatasha Rota is a 28 y.o. female who presents to Urgent Care today for nausea headache fatigue a few episodes of vomiting and diarrhea. The symptoms started about 6 days ago and have worsened recently. She notes nasal discharge that is occasionally blood-tinged. She denies any shortness of breath chest pain or palpitations. She notes some occasional coughing. She has tried NyQuil which helps a little. She notes a few days ago she has no dysuria which is currently resolved. She denies any current abdominal pain or vaginal discharge. Her last menstrual period was December 24.   Past Medical History  Diagnosis Date  . GERD (gastroesophageal reflux disease)    History  Substance Use Topics  . Smoking status: Former Smoker -- 0.25 packs/day  . Smokeless tobacco: Not on file  . Alcohol Use: Yes     Comment: occasional   ROS as above Medications: No current facility-administered medications for this encounter.   Current Outpatient Prescriptions  Medication Sig Dispense Refill  . ipratropium (ATROVENT) 0.06 % nasal spray Place 2 sprays into both nostrils 4 (four) times daily.  15 mL  1  . promethazine (PHENERGAN) 25 MG tablet Take 1 tablet (25 mg total) by mouth every 6 (six) hours as needed for nausea or vomiting.  30 tablet  0    Exam:  BP 127/85  Pulse 75  Temp(Src) 98.2 F (36.8 C) (Oral)  Resp 18  SpO2 100%  LMP 03/05/2013 Gen: Well NAD HEENT: EOMI,  MMM tympanic membranes are normal appearing bilaterally. Posterior pharynx with cobblestoning. Nasal turbinates are inflamed bilaterally. Lungs: Normal work of breathing. CTABL Heart: RRR no MRG Abd: NABS, Soft. Nondistended. Mildly tender over bladder. No masses palpated. No rebound or guarding. No CV angle tenderness to percussion Exts: Brisk capillary refill, warm and well perfused.   Results for orders placed during the hospital encounter of 03/28/13 (from the past 24 hour(s))  POCT URINALYSIS DIP (DEVICE)     Status: None   Collection Time    03/28/13  9:36 AM      Result Value Range   Glucose, UA NEGATIVE  NEGATIVE mg/dL   Bilirubin Urine NEGATIVE  NEGATIVE   Ketones, ur NEGATIVE  NEGATIVE mg/dL   Specific Gravity, Urine 1.025  1.005 - 1.030   Hgb urine dipstick NEGATIVE  NEGATIVE   pH 6.5  5.0 - 8.0   Protein, ur NEGATIVE  NEGATIVE mg/dL   Urobilinogen, UA 0.2  0.0 - 1.0 mg/dL   Nitrite NEGATIVE  NEGATIVE   Leukocytes, UA NEGATIVE  NEGATIVE  POCT PREGNANCY, URINE     Status: None   Collection Time    03/28/13  9:39 AM      Result Value Range   Preg Test, Ur NEGATIVE  NEGATIVE   No results found.  Assessment and Plan: 28 y.o. female with viral illness. Plan to treat symptomatically with Tylenol Phenergan and Atrovent nasal spray. Followup with primary care provider as needed..  Discussed warning signs or symptoms. Please see discharge instructions. Patient expresses understanding.    Rodolph BongEvan S Nekisha Mcdiarmid, MD 03/28/13 [phone]

## 2013-03-28 NOTE — ED Notes (Signed)
C/o nausea and headaches that started 6 days ago that comes and goes. Pain is 6/10. Stated that it feels like sinus pressure. Stated that when she coughs and blows her nose, blood comes out. Stated that she had diarrhea this morning, gags, but never produced any vomit. Written by: Marga MelnickQuaNeisha Jones, SMA

## 2013-03-28 NOTE — Discharge Instructions (Signed)
Thank you for coming in today. Use Tylenol for pain fevers chills bodyaches and sore throat. Use Atrovent nasal spray for runny nose. Use Phenergan as needed for vomiting. Call or go to the emergency room if you get worse, have trouble breathing, have chest pains, or palpitations.  If your belly pain worsens, or you have high fever, bad vomiting, blood in your stool or black tarry stool go to the Emergency Room.

## 2014-01-12 ENCOUNTER — Encounter (HOSPITAL_COMMUNITY): Payer: Self-pay | Admitting: Emergency Medicine

## 2014-02-17 ENCOUNTER — Encounter (HOSPITAL_COMMUNITY): Payer: Self-pay | Admitting: Emergency Medicine

## 2014-02-17 ENCOUNTER — Emergency Department (INDEPENDENT_AMBULATORY_CARE_PROVIDER_SITE_OTHER)
Admission: EM | Admit: 2014-02-17 | Discharge: 2014-02-17 | Disposition: A | Discharge status: Home / Self Care | Payer: Self-pay | Source: Home / Self Care | Attending: Emergency Medicine | Admitting: Emergency Medicine

## 2014-02-17 DIAGNOSIS — R209 Unspecified disturbances of skin sensation: Secondary | ICD-10-CM

## 2014-02-17 MED ORDER — IBUPROFEN 600 MG PO TABS: 600.0000 mg | ORAL_TABLET | Freq: Three times a day (TID) | ORAL | Status: DC | PRN
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
## 2014-02-17 NOTE — Discharge Instructions (Signed)
Take ibuprofen 600mg  every 8 hours as needed. You should be feeling better in the next 2 weeks. If your symptoms worsen or change, please come back.

## 2014-05-01 ENCOUNTER — Other Ambulatory Visit (HOSPITAL_COMMUNITY)
Admission: RE | Admit: 2014-05-01 | Discharge: 2014-05-01 | Disposition: A | Discharge status: Home / Self Care | Payer: Self-pay | Source: Ambulatory Visit | Attending: Family Medicine | Admitting: Family Medicine

## 2014-05-01 ENCOUNTER — Encounter (HOSPITAL_COMMUNITY): Payer: Self-pay | Admitting: Emergency Medicine

## 2014-05-01 ENCOUNTER — Emergency Department (INDEPENDENT_AMBULATORY_CARE_PROVIDER_SITE_OTHER)
Admission: EM | Admit: 2014-05-01 | Discharge: 2014-05-01 | Disposition: A | Discharge status: Home / Self Care | Payer: Self-pay | Source: Home / Self Care | Attending: Family Medicine | Admitting: Family Medicine

## 2014-05-01 DIAGNOSIS — N760 Acute vaginitis: Secondary | ICD-10-CM | POA: Insufficient documentation

## 2014-05-01 DIAGNOSIS — R1030 Lower abdominal pain, unspecified: Secondary | ICD-10-CM

## 2014-05-01 DIAGNOSIS — Z113 Encounter for screening for infections with a predominantly sexual mode of transmission: Secondary | ICD-10-CM | POA: Insufficient documentation

## 2014-05-01 DIAGNOSIS — J0100 Acute maxillary sinusitis, unspecified: Secondary | ICD-10-CM

## 2014-05-01 DIAGNOSIS — R350 Frequency of micturition: Secondary | ICD-10-CM

## 2014-05-01 LAB — POCT URINALYSIS DIP (DEVICE)
Bilirubin Urine: NEGATIVE
GLUCOSE, UA: NEGATIVE mg/dL
Hgb urine dipstick: NEGATIVE
Ketones, ur: NEGATIVE mg/dL
LEUKOCYTES UA: NEGATIVE
NITRITE: NEGATIVE
Protein, ur: NEGATIVE mg/dL
SPECIFIC GRAVITY, URINE: 1.02 (ref 1.005–1.030)
UROBILINOGEN UA: 0.2 mg/dL (ref 0.0–1.0)
pH: 8 (ref 5.0–8.0)

## 2014-05-01 LAB — POCT PREGNANCY, URINE: PREG TEST UR: NEGATIVE

## 2014-05-01 LAB — CERVICOVAGINAL ANCILLARY ONLY
CHLAMYDIA, DNA PROBE: NEGATIVE
Neisseria Gonorrhea: NEGATIVE

## 2014-05-01 MED ORDER — METRONIDAZOLE 500 MG PO TABS: 500.0000 mg | ORAL_TABLET | Freq: Two times a day (BID) | ORAL | Status: DC

## 2014-05-01 MED ORDER — DOXYCYCLINE HYCLATE 100 MG PO CAPS: 100.0000 mg | ORAL_CAPSULE | Freq: Two times a day (BID) | ORAL | Status: DC

## 2014-05-01 NOTE — ED Notes (Signed)
Patient called w Rx questions 

## 2014-05-01 NOTE — Discharge Instructions (Signed)
Abdominal Pain, Women °Abdominal (stomach, pelvic, or belly) pain can be caused by many things. It is important to tell your doctor: °· The location of the pain. °· Does it come and go or is it present all the time? °· Are there things that start the pain (eating certain foods, exercise)? °· Are there other symptoms associated with the pain (fever, nausea, vomiting, diarrhea)? °All of this is helpful to know when trying to find the cause of the pain. °CAUSES  °· Stomach: virus or bacteria infection, or ulcer. °· Intestine: appendicitis (inflamed appendix), regional ileitis (Crohn's disease), ulcerative colitis (inflamed colon), irritable bowel syndrome, diverticulitis (inflamed diverticulum of the colon), or cancer of the stomach or intestine. °· Gallbladder disease or stones in the gallbladder. °· Kidney disease, kidney stones, or infection. °· Pancreas infection or cancer. °· Fibromyalgia (pain disorder). °· Diseases of the female organs: °· Uterus: fibroid (non-cancerous) tumors or infection. °· Fallopian tubes: infection or tubal pregnancy. °· Ovary: cysts or tumors. °· Pelvic adhesions (scar tissue). °· Endometriosis (uterus lining tissue growing in the pelvis and on the pelvic organs). °· Pelvic congestion syndrome (female organs filling up with blood just before the menstrual period). °· Pain with the menstrual period. °· Pain with ovulation (producing an egg). °· Pain with an IUD (intrauterine device, birth control) in the uterus. °· Cancer of the female organs. °· Functional pain (pain not caused by a disease, may improve without treatment). °· Psychological pain. °· Depression. °DIAGNOSIS  °Your doctor will decide the seriousness of your pain by doing an examination. °· Blood tests. °· X-rays. °· Ultrasound. °· CT scan (computed tomography, special type of X-ray). °· MRI (magnetic resonance imaging). °· Cultures, for infection. °· Barium enema (dye inserted in the large intestine, to better view it with  X-rays). °· Colonoscopy (looking in intestine with a lighted tube). °· Laparoscopy (minor surgery, looking in abdomen with a lighted tube). °· Major abdominal exploratory surgery (looking in abdomen with a large incision). °TREATMENT  °The treatment will depend on the cause of the pain.  °· Many cases can be observed and treated at home. °· Over-the-counter medicines recommended by your caregiver. °· Prescription medicine. °· Antibiotics, for infection. °· Birth control pills, for painful periods or for ovulation pain. °· Hormone treatment, for endometriosis. °· Nerve blocking injections. °· Physical therapy. °· Antidepressants. °· Counseling with a psychologist or psychiatrist. °· Minor or major surgery. °HOME CARE INSTRUCTIONS  °· Do not take laxatives, unless directed by your caregiver. °· Take over-the-counter pain medicine only if ordered by your caregiver. Do not take aspirin because it can cause an upset stomach or bleeding. °· Try a clear liquid diet (broth or water) as ordered by your caregiver. Slowly move to a bland diet, as tolerated, if the pain is related to the stomach or intestine. °· Have a thermometer and take your temperature several times a day, and record it. °· Bed rest and sleep, if it helps the pain. °· Avoid sexual intercourse, if it causes pain. °· Avoid stressful situations. °· Keep your follow-up appointments and tests, as your caregiver orders. °· If the pain does not go away with medicine or surgery, you may try: °· Acupuncture. °· Relaxation exercises (yoga, meditation). °· Group therapy. °· Counseling. °SEEK MEDICAL CARE IF:  °· You notice certain foods cause stomach pain. °· Your home care treatment is not helping your pain. °· You need stronger pain medicine. °· You want your IUD removed. °· You feel faint or   lightheaded.  You develop nausea and vomiting.  You develop a rash.  You are having side effects or an allergy to your medicine. SEEK IMMEDIATE MEDICAL CARE IF:   Your  pain does not go away or gets worse.  You have a fever.  Your pain is felt only in portions of the abdomen. The right side could possibly be appendicitis. The left lower portion of the abdomen could be colitis or diverticulitis.  You are passing blood in your stools (bright red or black tarry stools, with or without vomiting).  You have blood in your urine.  You develop chills, with or without a fever.  You pass out. MAKE SURE YOU:   Understand these instructions.  Will watch your condition.  Will get help right away if you are not doing well or get worse. Document Released: 12/25/2006 Document Revised: 07/14/2013 Document Reviewed: 01/14/2009 [facility] Patient Information 2015 [place], [place]. This information is not intended to replace advice given to you by your health care provider. Make sure you discuss any questions you have with your health care provider.  Sinusitis Sinusitis is redness, soreness, and inflammation of the paranasal sinuses. Paranasal sinuses are air pockets within the bones of your face (beneath the eyes, the middle of the forehead, or above the eyes). In healthy paranasal sinuses, mucus is able to drain out, and air is able to circulate through them by way of your nose. However, when your paranasal sinuses are inflamed, mucus and air can become trapped. This can allow bacteria and other germs to grow and cause infection. Sinusitis can develop quickly and last only a short time (acute) or continue over a long period (chronic). Sinusitis that lasts for more than 12 weeks is considered chronic.  CAUSES  Causes of sinusitis include:  Allergies.  Structural abnormalities, such as displacement of the cartilage that separates your nostrils (deviated septum), which can decrease the air flow through your nose and sinuses and affect sinus drainage.  Functional abnormalities, such as when the small hairs (cilia) that line your sinuses and help remove mucus do not work  properly or are not present. SIGNS AND SYMPTOMS  Symptoms of acute and chronic sinusitis are the same. The primary symptoms are pain and pressure around the affected sinuses. Other symptoms include:  Upper toothache.  Earache.  Headache.  Bad breath.  Decreased sense of smell and taste.  A cough, which worsens when you are lying flat.  Fatigue.  Fever.  Thick drainage from your nose, which often is green and may contain pus (purulent).  Swelling and warmth over the affected sinuses. DIAGNOSIS  Your health care provider will perform a physical exam. During the exam, your health care provider may:  Look in your nose for signs of abnormal growths in your nostrils (nasal polyps).  Tap over the affected sinus to check for signs of infection.  View the inside of your sinuses (endoscopy) using an imaging device that has a light attached (endoscope). If your health care provider suspects that you have chronic sinusitis, one or more of the following tests may be recommended:  Allergy tests.  Nasal culture. A sample of mucus is taken from your nose, sent to a lab, and screened for bacteria.  Nasal cytology. A sample of mucus is taken from your nose and examined by your health care provider to determine if your sinusitis is related to an allergy. TREATMENT  Most cases of acute sinusitis are related to a viral infection and will resolve on their  own within 10 days. Sometimes medicines are prescribed to help relieve symptoms (pain medicine, decongestants, nasal steroid sprays, or saline sprays).  However, for sinusitis related to a bacterial infection, your health care provider will prescribe antibiotic medicines. These are medicines that will help kill the bacteria causing the infection.  Rarely, sinusitis is caused by a fungal infection. In theses cases, your health care provider will prescribe antifungal medicine. For some cases of chronic sinusitis, surgery is needed. Generally,  these are cases in which sinusitis recurs more than 3 times per year, despite other treatments. HOME CARE INSTRUCTIONS   Drink plenty of water. Water helps thin the mucus so your sinuses can drain more easily.  Use a humidifier.  Inhale steam 3 to 4 times a day (for example, sit in the bathroom with the shower running).  Apply a warm, moist washcloth to your face 3 to 4 times a day, or as directed by your health care provider.  Use saline nasal sprays to help moisten and clean your sinuses.  Take medicines only as directed by your health care provider.  If you were prescribed either an antibiotic or antifungal medicine, finish it all even if you start to feel better. SEEK IMMEDIATE MEDICAL CARE IF:  You have increasing pain or severe headaches.  You have nausea, vomiting, or drowsiness.  You have swelling around your face.  You have vision problems.  You have a stiff neck.  You have difficulty breathing. MAKE SURE YOU:   Understand these instructions.  Will watch your condition.  Will get help right away if you are not doing well or get worse. Document Released: 02/27/2005 Document Revised: 07/14/2013 Document Reviewed: 03/14/2011 [facility] Patient Information 2015 [place], [place]. This information is not intended to replace advice given to you by your health care provider. Make sure you discuss any questions you have with your health care provider.  Urinary Frequency The number of times a normal person urinates depends upon how much liquid they take in and how much liquid they are losing. If the temperature is hot and there is high humidity, then the person will sweat more and usually breathe a little more frequently. These factors decrease the amount of frequency of urination that would be considered normal. The amount you drink is easily determined, but the amount of fluid lost is sometimes more difficult to calculate.  Fluid is lost in two ways:  Sensible fluid loss is  usually measured by the amount of urine that you get rid of. Losses of fluid can also occur with diarrhea.  Insensible fluid loss is more difficult to measure. It is caused by evaporation. Insensible loss of fluid occurs through breathing and sweating. It usually ranges from a little less than a quart to a little more than a quart of fluid a day. In normal temperatures and activity levels, the average person may urinate 4 to 7 times in a 24-hour period. Needing to urinate more often than that could indicate a problem. If one urinates 4 to 7 times in 24 hours and has large volumes each time, that could indicate a different problem from one who urinates 4 to 7 times a day and has small volumes. The time of urinating is also important. Most urinating should be done during the waking hours. Getting up at night to urinate frequently can indicate some problems. CAUSES  The bladder is the organ in your lower abdomen that holds urine. Like a balloon, it swells some as it fills up. Your  nerves sense this and tell you it is time to head for the bathroom. There are a number of reasons that you might feel the need to urinate more often than usual. They include:  Urinary tract infection. This is usually associated with other signs such as burning when you urinate.  In men, problems with the prostate (a walnut-size gland that is located near the tube that carries urine out of your body). There are two reasons why the prostate can cause an increased frequency of urination:  An enlarged prostate that does not let the bladder empty well. If the bladder only half empties when you urinate, then it only has half the capacity to fill before you have to urinate again.  The nerves in the bladder become more hypersensitive with an increased size of the prostate even if the bladder empties completely.  Pregnancy.  Obesity. Excess weight is more likely to cause a problem for women than for men.  Bladder stones or other  bladder problems.  Caffeine.  Alcohol.  Medications. For example, drugs that help the body get rid of extra fluid (diuretics) increase urine production. Some other medicines must be taken with lots of fluids.  Muscle or nerve weakness. This might be the result of a spinal cord injury, a stroke, multiple sclerosis, or Parkinson disease.  Long-standing diabetes can decrease the sensation of the bladder. This loss of sensation makes it harder to sense the bladder needs to be emptied. Over a period of years, the bladder is stretched out by constant overfilling. This weakens the bladder muscles so that the bladder does not empty well and has less capacity to fill with new urine.  Interstitial cystitis (also called painful bladder syndrome). This condition develops because the tissues that line the inside of the bladder are inflamed (inflammation is the body's way of reacting to injury or infection). It causes pain and frequent urination. It occurs in women more often than in men. DIAGNOSIS   To decide what might be causing your urinary frequency, your health care provider will probably:  Ask about symptoms you have noticed.  Ask about your overall health. This will include questions about any medications you are taking.  Do a physical examination.  Order some tests. These might include:  A blood test to check for diabetes or other health issues that could be contributing to the problem.  Urine testing. This could measure the flow of urine and the pressure on the bladder.  A test of your neurological system (the brain, spinal cord, and nerves). This is the system that senses the need to urinate.  A bladder test to check whether it is emptying completely when you urinate.  Cystoscopy. This test uses a thin tube with a tiny camera on it. It offers a look inside your urethra and bladder to see if there are problems.  Imaging tests. You might be given a contrast dye and then asked to  urinate. X-rays are taken to see how your bladder is working. TREATMENT  It is important for you to be evaluated to determine if the amount or frequency that you have is unusual or abnormal. If it is found to be abnormal, the cause should be determined and this can usually be found out easily. Depending upon the cause, treatment could include medication, stimulation of the nerves, or surgery. There are not too many things that you can do as an individual to change your urinary frequency. It is important that you balance the amount of  fluid intake needed to compensate for your activity and the temperature. Medical problems will be diagnosed and taken care of by your physician. There is no particular bladder training such as Kegel exercises that you can do to help urinary frequency. This is an exercise that is usually recommended for people who have leaking of urine when they laugh, cough, or sneeze. HOME CARE INSTRUCTIONS   Take any medications your health care provider prescribed or suggested. Follow the directions carefully.  Practice any lifestyle changes that are recommended. These might include:  Drinking less fluid or drinking at different times of the day. If you need to urinate often during the night, for example, you may need to stop drinking fluids early in the evening.  Cutting down on caffeine or alcohol. They both can make you need to urinate more often than normal. Caffeine is found in coffee, tea, and sodas.  Losing weight, if that is recommended.  Keep a journal or a log. You might be asked to record how much you drink and when and where you feel the need to urinate. This will also help evaluate how well the treatment provided by your physician is working. SEEK MEDICAL CARE IF:   Your need to urinate often gets worse.  You feel increased pain or irritation when you urinate.  You notice blood in your urine.  You have questions about any medications that your health care  provider recommended.  You notice blood, pus, or swelling at the site of any test or treatment procedure.  You develop a fever of more than 100.76F (38.1C). SEEK IMMEDIATE MEDICAL CARE IF:  You develop a fever of more than 102.63F (38.9C). Document Released: 12/24/2008 Document Revised: 07/14/2013 Document Reviewed: 12/24/2008 [facility] Patient Information 2015 [place], [place]. This information is not intended to replace advice given to you by your health care provider. Make sure you discuss any questions you have with your health care provider.  Vaginitis Vaginitis is an inflammation of the vagina. It is most often caused by a change in the normal balance of the bacteria and yeast that live in the vagina. This change in balance causes an overgrowth of certain bacteria or yeast, which causes the inflammation. There are different types of vaginitis, but the most common types are:  Bacterial vaginosis.  Yeast infection (candidiasis).  Trichomoniasis vaginitis. This is a sexually transmitted infection (STI).  Viral vaginitis.  Atropic vaginitis.  Allergic vaginitis. CAUSES  The cause depends on the type of vaginitis. Vaginitis can be caused by:  Bacteria (bacterial vaginosis).  Yeast (yeast infection).  A parasite (trichomoniasis vaginitis)  A virus (viral vaginitis).  Low hormone levels (atrophic vaginitis). Low hormone levels can occur during pregnancy, breastfeeding, or after menopause.  Irritants, such as bubble baths, scented tampons, and feminine sprays (allergic vaginitis). Other factors can change the normal balance of the yeast and bacteria that live in the vagina. These include:  Antibiotic medicines.  Poor hygiene.  Diaphragms, vaginal sponges, spermicides, birth control pills, and intrauterine devices (IUD).  Sexual intercourse.  Infection.  Uncontrolled diabetes.  A weakened immune system. SYMPTOMS  Symptoms can vary depending on the cause of the  vaginitis. Common symptoms include:  Abnormal vaginal discharge.  The discharge is white, gray, or yellow with bacterial vaginosis.  The discharge is thick, white, and cheesy with a yeast infection.  The discharge is frothy and yellow or greenish with trichomoniasis.  A bad vaginal odor.  The odor is fishy with bacterial vaginosis.  Vaginal itching, pain,  or swelling.  Painful intercourse.  Pain or burning when urinating. Sometimes, there are no symptoms. TREATMENT  Treatment will vary depending on the type of infection.   Bacterial vaginosis and trichomoniasis are often treated with antibiotic creams or pills.  Yeast infections are often treated with antifungal medicines, such as vaginal creams or suppositories.  Viral vaginitis has no cure, but symptoms can be treated with medicines that relieve discomfort. Your sexual partner should be treated as well.  Atrophic vaginitis may be treated with an estrogen cream, pill, suppository, or vaginal ring. If vaginal dryness occurs, lubricants and moisturizing creams may help. You may be told to avoid scented soaps, sprays, or douches.  Allergic vaginitis treatment involves quitting the use of the product that is causing the problem. Vaginal creams can be used to treat the symptoms. HOME CARE INSTRUCTIONS   Take all medicines as directed by your caregiver.  Keep your genital area clean and dry. Avoid soap and only rinse the area with water.  Avoid douching. It can remove the healthy bacteria in the vagina.  Do not use tampons or have sexual intercourse until your vaginitis has been treated. Use sanitary pads while you have vaginitis.  Wipe from front to back. This avoids the spread of bacteria from the rectum to the vagina.  Let air reach your genital area.  Wear cotton underwear to decrease moisture buildup.  Avoid wearing underwear while you sleep until your vaginitis is gone.  Avoid tight pants and underwear or nylons  without a cotton panel.  Take off wet clothing (especially bathing suits) as soon as possible.  Use mild, non-scented products. Avoid using irritants, such as:  Scented feminine sprays.  Fabric softeners.  Scented detergents.  Scented tampons.  Scented soaps or bubble baths.  Practice safe sex and use condoms. Condoms may prevent the spread of trichomoniasis and viral vaginitis. SEEK MEDICAL CARE IF:   You have abdominal pain.  You have a fever or persistent symptoms for more than 2-3 days.  You have a fever and your symptoms suddenly get worse. Document Released: 12/25/2006 Document Revised: 11/22/2011 Document Reviewed: 08/10/2011 [facility] Patient Information 2015 [place]. This information is not intended to replace advice given to you by your health care provider. Make sure you discuss any questions you have with your health care provider.

## 2014-05-01 NOTE — ED Notes (Signed)
Pt states that she believes that she has sinus infection also stating that she believes she has a UTI with frequent urination and vaginal discharge since last week.

## 2014-05-01 NOTE — ED Provider Notes (Signed)
CSN: [id number]     Arrival date & time 05/01/14  0831 History   First MD Initiated Contact with Patient 05/01/14 [phone]     Chief Complaint  Patient presents with  . Urinary Tract Infection  . Sinus Problem   (Consider location/radiation/quality/duration/timing/severity/associated sxs/prior Treatment) HPI   Pt presents c/o possible sinus infection as well as possible UTI. She has nasal congestion, sinus pressure, rhinorrhea for about a week. She also has headaches and a mild cough. Also for one week she thinks she has a UTI. She has urinary frequency and vaginal discharge. Also mild lower abdominal pain and nausea without vomiting. No flank pain, fever, chills. No concern for STDs.  Past Medical History  Diagnosis Date  . GERD (gastroesophageal reflux disease)    Past Surgical History  Procedure Laterality Date  . No past surgeries     History reviewed. No pertinent family history. History  Substance Use Topics  . Smoking status: Former Smoker -- 0.25 packs/day  . Smokeless tobacco: Not on file  . Alcohol Use: Yes     Comment: occasional   OB History    Gravida Para Term Preterm AB TAB SAB Ectopic Multiple Living   2 1  1      1      Review of Systems  Constitutional: Negative for fever and chills.  HENT: Positive for congestion, ear pain, rhinorrhea and sinus pressure. Negative for sore throat.   Respiratory: Positive for cough. Negative for shortness of breath.   Cardiovascular: Negative for chest pain.  Gastrointestinal: Positive for nausea and abdominal pain. Negative for vomiting and diarrhea.  Genitourinary: Positive for frequency and vaginal discharge. Negative for dysuria, urgency and vaginal bleeding.  All other systems reviewed and are negative.   Allergies  Ondansetron hcl  Home Medications   Prior to Admission medications   Medication Sig Start Date End Date Taking? Authorizing Provider  doxycycline (VIBRAMYCIN) 100 MG capsule Take 1 capsule (100 mg total)  by mouth 2 (two) times daily. 05/01/14   Graylon GoodZachary H Kunaal Walkins, PA-C  ibuprofen (ADVIL,MOTRIN) 600 MG tablet Take 1 tablet (600 mg total) by mouth every 8 (eight) hours as needed for moderate pain. 02/17/14   Charm RingsErin J Honig, MD  ipratropium (ATROVENT) 0.06 % nasal spray Place 2 sprays into both nostrils 4 (four) times daily. 03/28/13   Rodolph BongEvan S Corey, MD  metroNIDAZOLE (FLAGYL) 500 MG tablet Take 1 tablet (500 mg total) by mouth 2 (two) times daily. 05/01/14   Graylon GoodZachary H Gracyn Santillanes, PA-C  promethazine (PHENERGAN) 25 MG tablet Take 1 tablet (25 mg total) by mouth every 6 (six) hours as needed for nausea or vomiting. 03/28/13   Rodolph BongEvan S Corey, MD   BP 136/86 mmHg  Pulse 69  Temp(Src) 98.2 F (36.8 C) (Oral)  Resp 16  SpO2 98%  LMP 04/17/2014 Physical Exam  Constitutional: She is oriented to person, place, and time. Vital signs are normal. She appears well-developed and well-nourished. No distress.  HENT:  Head: Normocephalic and atraumatic.  Right Ear: External ear normal.  Left Ear: External ear normal.  Nose: Right sinus exhibits maxillary sinus tenderness. Right sinus exhibits no frontal sinus tenderness. Left sinus exhibits maxillary sinus tenderness. Left sinus exhibits no frontal sinus tenderness.  Mouth/Throat: Oropharynx is clear and moist. No oropharyngeal exudate.  Eyes: Conjunctivae are normal. Right eye exhibits no discharge. Left eye exhibits no discharge.  Neck: Normal range of motion. Neck supple.  Cardiovascular: Normal rate, regular rhythm and normal heart sounds.  Pulmonary/Chest: Effort normal and breath sounds normal. No respiratory distress.  Abdominal: There is tenderness in the right lower quadrant, suprapubic area and left lower quadrant. There is no rigidity, no rebound, no guarding and no CVA tenderness.  Genitourinary: There is no tenderness or lesion on the right labia. There is no tenderness or lesion on the left labia. Cervix exhibits no motion tenderness, no discharge and no  friability. Right adnexum displays no tenderness and no fullness. Left adnexum displays no tenderness and no fullness. No erythema or bleeding in the vagina. Vaginal discharge (thin white) found.  Lymphadenopathy:    She has no cervical adenopathy.       Right: No inguinal adenopathy present.       Left: No inguinal adenopathy present.  Neurological: She is alert and oriented to person, place, and time. She has normal strength. Coordination normal.  Skin: Skin is warm and dry. No rash noted. She is not diaphoretic.  Psychiatric: She has a normal mood and affect. Judgment normal.  Nursing note and vitals reviewed.   ED Course  Procedures (including critical care time) Labs Review Labs Reviewed  POCT URINALYSIS DIP (DEVICE)  POCT PREGNANCY, URINE  CERVICOVAGINAL ANCILLARY ONLY    Imaging Review No results found.   MDM   1. Acute maxillary sinusitis, recurrence not specified   2. Vaginitis   3. Abdominal pain, suprapubic, unspecified laterality   4. Urinary frequency    Most likely BV as well as sinusitis, treat with doxy and flagyl, f/u PRN if no improvement in a few days    Meds ordered this encounter  Medications  . metroNIDAZOLE (FLAGYL) 500 MG tablet    Sig: Take 1 tablet (500 mg total) by mouth 2 (two) times daily.    Dispense:  14 tablet    Refill:  0    Order Specific Question:  Supervising Provider    Answer:  Linna Hoff [phone]  . doxycycline (VIBRAMYCIN) 100 MG capsule    Sig: Take 1 capsule (100 mg total) by mouth 2 (two) times daily.    Dispense:  14 capsule    Refill:  0    Order Specific Question:  Supervising Provider    Answer:  Bradd Canary D [[id number]]       Graylon Good, PA-C 05/01/14 [phone]

## 2014-05-04 LAB — CERVICOVAGINAL ANCILLARY ONLY
WET PREP (BD AFFIRM): NEGATIVE
Wet Prep (BD Affirm): NEGATIVE
Wet Prep (BD Affirm): POSITIVE — AB

## 2014-05-05 NOTE — ED Notes (Signed)
GC/Chlamydia neg., Affirm: Gardnerella pos., Candida and Trich neg.  Pt. adequately treated with Flagyl. Vassie MoselleYork, Deanna Wiater M 05/05/2014

## 2014-06-11 ENCOUNTER — Emergency Department (HOSPITAL_COMMUNITY): Payer: Self-pay

## 2014-06-11 ENCOUNTER — Encounter (HOSPITAL_COMMUNITY): Payer: Self-pay | Admitting: Emergency Medicine

## 2014-06-11 ENCOUNTER — Emergency Department (HOSPITAL_COMMUNITY)
Admission: EM | Admit: 2014-06-11 | Discharge: 2014-06-11 | Disposition: A | Discharge status: Home / Self Care | Payer: Self-pay | Attending: Emergency Medicine | Admitting: Emergency Medicine

## 2014-06-11 DIAGNOSIS — Z79899 Other long term (current) drug therapy: Secondary | ICD-10-CM | POA: Insufficient documentation

## 2014-06-11 DIAGNOSIS — R51 Headache: Secondary | ICD-10-CM | POA: Insufficient documentation

## 2014-06-11 DIAGNOSIS — K219 Gastro-esophageal reflux disease without esophagitis: Secondary | ICD-10-CM | POA: Insufficient documentation

## 2014-06-11 DIAGNOSIS — R102 Pelvic and perineal pain: Secondary | ICD-10-CM

## 2014-06-11 DIAGNOSIS — Z87891 Personal history of nicotine dependence: Secondary | ICD-10-CM | POA: Insufficient documentation

## 2014-06-11 DIAGNOSIS — Z3202 Encounter for pregnancy test, result negative: Secondary | ICD-10-CM | POA: Insufficient documentation

## 2014-06-11 DIAGNOSIS — K5900 Constipation, unspecified: Secondary | ICD-10-CM | POA: Insufficient documentation

## 2014-06-11 DIAGNOSIS — M549 Dorsalgia, unspecified: Secondary | ICD-10-CM | POA: Insufficient documentation

## 2014-06-11 LAB — CBC WITH DIFFERENTIAL/PLATELET
BASOS PCT: 0 % (ref 0–1)
Basophils Absolute: 0 10*3/uL (ref 0.0–0.1)
EOS PCT: 0 % (ref 0–5)
Eosinophils Absolute: 0 10*3/uL (ref 0.0–0.7)
HCT: 38.6 % (ref 36.0–46.0)
Hemoglobin: 12.8 g/dL (ref 12.0–15.0)
Lymphocytes Relative: 16 % (ref 12–46)
Lymphs Abs: 1 10*3/uL (ref 0.7–4.0)
MCH: 28.5 pg (ref 26.0–34.0)
MCHC: 33.2 g/dL (ref 30.0–36.0)
MCV: 86 fL (ref 78.0–100.0)
MONOS PCT: 5 % (ref 3–12)
Monocytes Absolute: 0.3 10*3/uL (ref 0.1–1.0)
NEUTROS ABS: 5 10*3/uL (ref 1.7–7.7)
Neutrophils Relative %: 79 % — ABNORMAL HIGH (ref 43–77)
Platelets: 268 10*3/uL (ref 150–400)
RBC: 4.49 MIL/uL (ref 3.87–5.11)
RDW: 13.3 % (ref 11.5–15.5)
WBC: 6.4 10*3/uL (ref 4.0–10.5)

## 2014-06-11 LAB — URINALYSIS, ROUTINE W REFLEX MICROSCOPIC
BILIRUBIN URINE: NEGATIVE
Glucose, UA: NEGATIVE mg/dL
Hgb urine dipstick: NEGATIVE
Ketones, ur: NEGATIVE mg/dL
LEUKOCYTES UA: NEGATIVE
NITRITE: NEGATIVE
PH: 5.5 (ref 5.0–8.0)
Protein, ur: NEGATIVE mg/dL
Specific Gravity, Urine: 1.022 (ref 1.005–1.030)
UROBILINOGEN UA: 0.2 mg/dL (ref 0.0–1.0)

## 2014-06-11 LAB — COMPREHENSIVE METABOLIC PANEL
ALT: 12 U/L (ref 0–35)
AST: 22 U/L (ref 0–37)
Albumin: 3.5 g/dL (ref 3.5–5.2)
Alkaline Phosphatase: 68 U/L (ref 39–117)
Anion gap: 6 (ref 5–15)
BUN: 6 mg/dL (ref 6–23)
CALCIUM: 9 mg/dL (ref 8.4–10.5)
CHLORIDE: 107 mmol/L (ref 96–112)
CO2: 24 mmol/L (ref 19–32)
Creatinine, Ser: 0.7 mg/dL (ref 0.50–1.10)
GFR calc Af Amer: >90 mL/min (ref >90)
GFR calc non Af Amer: >90 mL/min (ref >90)
Glucose, Bld: 85 mg/dL (ref 70–99)
Potassium: 3.6 mmol/L (ref 3.5–5.1)
Sodium: 137 mmol/L (ref 135–145)
Total Bilirubin: 0.6 mg/dL (ref 0.3–1.2)
Total Protein: 6.8 g/dL (ref 6.0–8.3)

## 2014-06-11 LAB — LIPASE, BLOOD: LIPASE: 20 U/L (ref 11–59)

## 2014-06-11 LAB — PREGNANCY, URINE: Preg Test, Ur: NEGATIVE

## 2014-06-11 MED ORDER — SODIUM CHLORIDE 0.9 % IV BOLUS (SEPSIS)
1000.0000 mL | Freq: Once | INTRAVENOUS | Status: AC
  Administered 2014-06-11: 1000 mL via INTRAVENOUS

## 2014-06-11 MED ORDER — POLYETHYLENE GLYCOL 3350 17 G PO PACK: 17.0000 g | PACK | Freq: Every day | ORAL | Status: DC

## 2014-06-11 MED ORDER — MORPHINE SULFATE 4 MG/ML IJ SOLN
4.0000 mg | Freq: Once | INTRAMUSCULAR | Status: AC
  Administered 2014-06-11: 4 mg via INTRAVENOUS
  Filled 2014-06-11: qty 1

## 2014-06-11 MED ORDER — IOHEXOL 300 MG/ML  SOLN
25.0000 mL | INTRAMUSCULAR | Status: AC
  Administered 2014-06-11: 25 mL via ORAL

## 2014-06-11 MED ORDER — IOHEXOL 300 MG/ML  SOLN
80.0000 mL | Freq: Once | INTRAMUSCULAR | Status: AC | PRN
  Administered 2014-06-11: 80 mL via INTRAVENOUS

## 2014-06-11 MED ORDER — PROMETHAZINE HCL 25 MG/ML IJ SOLN
12.5000 mg | Freq: Once | INTRAMUSCULAR | Status: AC
  Administered 2014-06-11: 12.5 mg via INTRAVENOUS
  Filled 2014-06-11: qty 1

## 2014-06-11 NOTE — ED Notes (Signed)
Finished with PO contrast; notified CT 

## 2014-06-11 NOTE — Discharge Instructions (Signed)

## 2014-06-11 NOTE — ED Notes (Signed)
Patient transported to CT 

## 2014-06-11 NOTE — ED Provider Notes (Signed)
CSN: [id number]     Arrival date & time 06/11/14  7829 History   First MD Initiated Contact with Patient 06/11/14 [phone]     Chief Complaint  Patient presents with  . Fever  . Abdominal Pain  . Back Pain  . Headache     (Consider location/radiation/quality/duration/timing/severity/associated sxs/prior Treatment) Patient is a 29 y.o. female presenting with fever, abdominal pain, back pain, and headaches. The history is provided by the patient.  Fever Max temp prior to arrival:  99 Temp source:  Subjective Severity:  Moderate Onset quality:  Gradual Duration:  2 days Timing:  Constant Progression:  Worsening Chronicity:  New Relieved by:  Nothing Worsened by:  Nothing tried Ineffective treatments:  None tried Associated symptoms: headaches   Risk factors: no sick contacts   Abdominal Pain Associated symptoms: fever   Back Pain Associated symptoms: abdominal pain, fever and headaches   Headache Associated symptoms: abdominal pain, back pain and fever     Past Medical History  Diagnosis Date  . GERD (gastroesophageal reflux disease)    Past Surgical History  Procedure Laterality Date  . No past surgeries     No family history on file. History  Substance Use Topics  . Smoking status: Former Smoker -- 0.25 packs/day  . Smokeless tobacco: Not on file  . Alcohol Use: Yes     Comment: socially   OB History    Gravida Para Term Preterm AB TAB SAB Ectopic Multiple Living   Review of Systems  Constitutional: Positive for fever.  Gastrointestinal: Positive for abdominal pain.  Musculoskeletal: Positive for back pain.  Neurological: Positive for headaches.  All other systems reviewed and are negative.     Allergies  Ondansetron hcl  Home Medications   Prior to Admission medications   Medication Sig Start Date End Date Taking? Authorizing Provider  acetaminophen (TYLENOL) 500 MG tablet Take 1,000 mg by mouth every 6 (six) hours as needed for  mild pain or moderate pain.   Yes Historical Provider, MD  Ranitidine HCl (ZANTAC PO) Take 1 tablet by mouth daily as needed (heartburn).   Yes Historical Provider, MD  doxycycline (VIBRAMYCIN) 100 MG capsule Take 1 capsule (100 mg total) by mouth 2 (two) times daily. Patient not taking: Reported on 06/11/2014 05/01/14   Graylon Good, PA-C  ibuprofen (ADVIL,MOTRIN) 600 MG tablet Take 1 tablet (600 mg total) by mouth every 8 (eight) hours as needed for moderate pain. Patient not taking: Reported on 06/11/2014 02/17/14   Charm Rings, MD  ipratropium (ATROVENT) 0.06 % nasal spray Place 2 sprays into both nostrils 4 (four) times daily. Patient not taking: Reported on 06/11/2014 03/28/13   Rodolph Bong, MD  metroNIDAZOLE (FLAGYL) 500 MG tablet Take 1 tablet (500 mg total) by mouth 2 (two) times daily. Patient not taking: Reported on 06/11/2014 05/01/14   Graylon Good, PA-C  promethazine (PHENERGAN) 25 MG tablet Take 1 tablet (25 mg total) by mouth every 6 (six) hours as needed for nausea or vomiting. Patient not taking: Reported on 06/11/2014 03/28/13   Rodolph Bong, MD   BP 129/76 mmHg  Pulse 71  Temp(Src) 99.2 F (37.3 C) (Oral)  Resp 16  SpO2 100%  LMP 06/04/2014 Physical Exam  Constitutional: She appears well-developed and well-nourished.  HENT:  Head: Normocephalic and atraumatic.  Right Ear: External ear normal.  Left Ear: External ear normal.  Nose: Nose  normal.  Mouth/Throat: Oropharynx is clear and moist.  Eyes: Conjunctivae and EOM are normal. Pupils are equal, round, and reactive to light.  Neck: Normal range of motion. Neck supple.  Cardiovascular: Normal rate and normal heart sounds.   Pulmonary/Chest: Effort normal and breath sounds normal.  Abdominal: Soft.  Musculoskeletal: Normal range of motion.  Neurological: She is alert.  Skin: Skin is warm.  Psychiatric: She has a normal mood and affect.  Nursing note and vitals reviewed.   ED Course  Procedures (including  critical care time) Labs Review Labs Reviewed  CBC WITH DIFFERENTIAL/PLATELET - Abnormal; Notable for the following:    Neutrophils Relative % 79 (*)    All other components within normal limits  COMPREHENSIVE METABOLIC PANEL  LIPASE, BLOOD  URINALYSIS, ROUTINE W REFLEX MICROSCOPIC  PREGNANCY, URINE    Imaging Review Ct Abdomen Pelvis W Contrast  06/11/2014   CLINICAL DATA:  Left flank and left lower quadrant abdominal pain for 2 days. Fever for 1 day with nausea and vomiting today. Constipation. No previous relevant surgery. Initial encounter.  EXAM: CT ABDOMEN AND PELVIS WITH CONTRAST  TECHNIQUE: Multidetector CT imaging of the abdomen and pelvis was performed using the standard protocol following bolus administration of intravenous contrast.  CONTRAST:  80mL OMNIPAQUE IOHEXOL 300 MG/ML  SOLN  COMPARISON:  CT urogram 10/24/2009.  FINDINGS: Lower chest: Mild dependent atelectasis at both lung bases. No significant pleural or pericardial effusion.  Hepatobiliary: The liver is normal in density without focal abnormality. No evidence of gallstones, gallbladder wall thickening or biliary dilatation.  Pancreas: Unremarkable. No pancreatic ductal dilatation or surrounding inflammatory changes.  Spleen: Normal in size without focal abnormality.  Adrenals/Urinary Tract: Both adrenal glands appear normal.Tiny low-density left renal lesions are too small to characterize, although likely cysts. The right kidney appears normal. No evidence of urinary tract calculus or hydronephrosis. The bladder is well distended without apparent abnormality.  Stomach/Bowel: No evidence of bowel wall thickening, distention or surrounding inflammatory change.The stomach is incompletely distended. The appendix appears normal. There is moderate stool throughout the colon.  Vascular/Lymphatic: There are no enlarged abdominal or pelvic lymph nodes. No significant vascular findings are present.  Reproductive: 1.8 cm left ovarian  follicle noted on image 66. There is no suspicious adnexal finding. The right ovary in uterus appear normal. A small amount of free pelvic fluid is present.  Other: No evidence of abdominal wall mass or hernia.  Musculoskeletal: No acute or significant osseous findings. Mild convex left scoliosis.  IMPRESSION: 1. No acute findings or explanation for the patient's symptoms demonstrated. No evidence of inflammatory process. 2. Moderate stool throughout the colon consistent with constipation. 3. Left ovarian follicle and free pelvic fluid within physiologic limits.   Electronically Signed   By: Carey BullocksWilliam  Veazey M.D.   On: 06/11/2014 14:03     EKG Interpretation None      MDM   Final diagnoses:  Pelvic pain in female  Constipation, unspecified constipation type    Pt counseled on results.   Pt given rx for miralax.   Pt advised to return if symptoms worsen or cahnge.    Lonia SkinnerLeslie K AmasaSofia, PA-C 06/11/14 1539  Azalia BilisKevin Campos, MD 06/12/14 [phone]

## 2014-06-11 NOTE — ED Notes (Signed)
Patient returned from CT

## 2014-06-11 NOTE — ED Notes (Signed)
Pt started having stomach and back pain 2 days ago-- thought it was reflux-- started running a fever last night-- vomiting/ nausea started this am. Cramping in abd

## 2014-07-17 ENCOUNTER — Encounter (HOSPITAL_COMMUNITY): Payer: Self-pay | Admitting: *Deleted

## 2014-07-17 ENCOUNTER — Emergency Department (INDEPENDENT_AMBULATORY_CARE_PROVIDER_SITE_OTHER)
Admission: EM | Admit: 2014-07-17 | Discharge: 2014-07-17 | Disposition: A | Discharge status: Home / Self Care | Payer: Self-pay | Source: Home / Self Care | Attending: Family Medicine | Admitting: Family Medicine

## 2014-07-17 DIAGNOSIS — K589 Irritable bowel syndrome without diarrhea: Secondary | ICD-10-CM

## 2014-07-17 MED ORDER — DIPHENOXYLATE-ATROPINE 2.5-0.025 MG PO TABS: 2.0000 | ORAL_TABLET | Freq: Three times a day (TID) | ORAL | Status: DC | PRN

## 2014-07-17 NOTE — ED Provider Notes (Signed)
CSN: [id number]     Arrival date & time 07/17/14  0801 History   First MD Initiated Contact with Patient 07/17/14 [phone]     Chief Complaint  Patient presents with  . Abdominal Cramping   (Consider location/radiation/quality/duration/timing/severity/associated sxs/prior Treatment) Patient is a 29 y.o. female presenting with cramps. The history is provided by the patient.  Abdominal Cramping This is a chronic problem. The current episode started more than 2 days ago. The problem has not changed since onset.Associated symptoms include abdominal pain, headaches and shortness of breath. Associated symptoms comments: Told she has ibs , now with abd cramps and diarrhea assoc sx, no blood, sl nausea, no vomiting.    Past Medical History  Diagnosis Date  . GERD (gastroesophageal reflux disease)    Past Surgical History  Procedure Laterality Date  . No past surgeries     History reviewed. No pertinent family history. History  Substance Use Topics  . Smoking status: Former Smoker -- 0.25 packs/day  . Smokeless tobacco: Not on file  . Alcohol Use: Yes     Comment: socially   OB History    Gravida Para Term Preterm AB TAB SAB Ectopic Multiple Living   2 1  1      1      Review of Systems  Constitutional: Negative.   Respiratory: Positive for shortness of breath.   Gastrointestinal: Positive for nausea, abdominal pain and diarrhea. Negative for vomiting, constipation, blood in stool, abdominal distention and rectal pain.  Genitourinary: Negative.   Neurological: Positive for headaches.    Allergies  Ondansetron hcl  Home Medications   Prior to Admission medications   Medication Sig Start Date End Date Taking? Authorizing Provider  acetaminophen (TYLENOL) 500 MG tablet Take 1,000 mg by mouth every 6 (six) hours as needed for mild pain or moderate pain.    Historical Provider, MD  diphenoxylate-atropine (LOMOTIL) 2.5-0.025 MG per tablet Take 2 tablets by mouth 3 (three) times daily as  needed for diarrhea or loose stools. 07/17/14   Linna HoffJames D Kindl, MD  doxycycline (VIBRAMYCIN) 100 MG capsule Take 1 capsule (100 mg total) by mouth 2 (two) times daily. Patient not taking: Reported on 06/11/2014 05/01/14   Graylon GoodZachary H Baker, PA-C  ibuprofen (ADVIL,MOTRIN) 600 MG tablet Take 1 tablet (600 mg total) by mouth every 8 (eight) hours as needed for moderate pain. Patient not taking: Reported on 06/11/2014 02/17/14   Charm RingsErin J Honig, MD  ipratropium (ATROVENT) 0.06 % nasal spray Place 2 sprays into both nostrils 4 (four) times daily. Patient not taking: Reported on 06/11/2014 03/28/13   Rodolph BongEvan S Corey, MD  metroNIDAZOLE (FLAGYL) 500 MG tablet Take 1 tablet (500 mg total) by mouth 2 (two) times daily. Patient not taking: Reported on 06/11/2014 05/01/14   Graylon GoodZachary H Baker, PA-C  polyethylene glycol [facility](MIRALAX) packet Take 17 g by mouth daily. 06/11/14   Elson AreasLeslie K Sofia, PA-C  promethazine (PHENERGAN) 25 MG tablet Take 1 tablet (25 mg total) by mouth every 6 (six) hours as needed for nausea or vomiting. Patient not taking: Reported on 06/11/2014 03/28/13   Rodolph BongEvan S Corey, MD  Ranitidine HCl (ZANTAC PO) Take 1 tablet by mouth daily as needed (heartburn).    Historical Provider, MD   BP 153/91 mmHg  Pulse 91  Temp(Src) 98.1 F (36.7 C) (Oral)  Resp 16  SpO2 99%  LMP 06/25/2014 Physical Exam  Constitutional: She is oriented to person, place, and time. She appears well-developed and well-nourished.  Pulmonary/Chest: Effort normal  and breath sounds normal.  Abdominal: Soft. Normal appearance and bowel sounds are normal. She exhibits no distension and no mass. There is generalized tenderness. There is no rigidity, no rebound, no guarding, no CVA tenderness, no tenderness at McBurney's point and negative Murphy's sign.  Neurological: She is alert and oriented to person, place, and time.  Skin: Skin is warm and dry.  Nursing note and vitals reviewed.   ED Course  Procedures (including critical care time) Labs  Review Labs Reviewed - No data to display  Imaging Review No results found.   MDM   1. IBS (irritable bowel syndrome)        Linna HoffJames D Kindl, MD 07/17/14 [phone]

## 2014-07-17 NOTE — Discharge Instructions (Signed)
Try medicine prescribed, diet as suggested, see specialist if further problems.

## 2014-07-17 NOTE — ED Notes (Signed)
abd  Cramps   Some  Nausea  No  Vomiting  No  Diarrhea     X  4  Days         Pt reports         Has   An  IBS

## 2014-11-12 ENCOUNTER — Emergency Department (INDEPENDENT_AMBULATORY_CARE_PROVIDER_SITE_OTHER)
Admission: EM | Admit: 2014-11-12 | Discharge: 2014-11-12 | Disposition: A | Discharge status: Home / Self Care | Payer: Self-pay | Source: Home / Self Care | Attending: Emergency Medicine | Admitting: Emergency Medicine

## 2014-11-12 ENCOUNTER — Encounter (HOSPITAL_COMMUNITY): Payer: Self-pay | Admitting: Emergency Medicine

## 2014-11-12 ENCOUNTER — Other Ambulatory Visit (HOSPITAL_COMMUNITY)
Admission: RE | Admit: 2014-11-12 | Discharge: 2014-11-12 | Disposition: A | Discharge status: Home / Self Care | Payer: Medicaid Other | Source: Ambulatory Visit | Attending: Emergency Medicine | Admitting: Emergency Medicine

## 2014-11-12 DIAGNOSIS — N760 Acute vaginitis: Secondary | ICD-10-CM

## 2014-11-12 DIAGNOSIS — B9689 Other specified bacterial agents as the cause of diseases classified elsewhere: Secondary | ICD-10-CM

## 2014-11-12 DIAGNOSIS — A499 Bacterial infection, unspecified: Secondary | ICD-10-CM

## 2014-11-12 DIAGNOSIS — Z113 Encounter for screening for infections with a predominantly sexual mode of transmission: Secondary | ICD-10-CM | POA: Insufficient documentation

## 2014-11-12 LAB — POCT URINALYSIS DIP (DEVICE)
Bilirubin Urine: NEGATIVE
GLUCOSE, UA: NEGATIVE mg/dL
Hgb urine dipstick: NEGATIVE
KETONES UR: NEGATIVE mg/dL
Leukocytes, UA: NEGATIVE
Nitrite: NEGATIVE
Protein, ur: NEGATIVE mg/dL
Specific Gravity, Urine: 1.02 (ref 1.005–1.030)
UROBILINOGEN UA: 0.2 mg/dL (ref 0.0–1.0)
pH: 7 (ref 5.0–8.0)

## 2014-11-12 LAB — POCT PREGNANCY, URINE: Preg Test, Ur: NEGATIVE

## 2014-11-12 MED ORDER — METRONIDAZOLE 500 MG PO TABS: 500.0000 mg | ORAL_TABLET | Freq: Two times a day (BID) | ORAL | Status: DC

## 2014-11-12 NOTE — ED Provider Notes (Signed)
CSN: [id number]     Arrival date & time 11/12/14  1624 History   First MD Initiated Contact with Patient 11/12/14 1652     Chief Complaint  Patient presents with  . Abdominal Pain   (Consider location/radiation/quality/duration/timing/severity/associated sxs/prior Treatment) HPI She is a 29 year old woman here for evaluation of pelvic cramping and vaginal discharge. She states she started having discharge about 3 weeks ago. She denies any odor or itching, but states she normally doesn't have discharge. About a week ago, she developed a nagging pain in her lower back that wraps around to her pelvis. She reports a cramping sensation in her pelvis, like her period is going to start. She states the pain comes and goes. Currently, she denies any back pain but does have some lower pelvic pain. She has continued to have the vaginal discharge. She denies any dysuria, hematuria, urgency, frequency. No constipation or diarrhea. She has had some intermittent nausea for the last 2-3 days. No fevers or chills. Her last period was August 9, which was about 10 days early. She does report unprotected sex on July 31 with a known partner.  Past Medical History  Diagnosis Date  . GERD (gastroesophageal reflux disease)    Past Surgical History  Procedure Laterality Date  . No past surgeries     No family history on file. Social History  Substance Use Topics  . Smoking status: Former Smoker -- 0.25 packs/day  . Smokeless tobacco: None  . Alcohol Use: Yes     Comment: socially   OB History    Gravida Para Term Preterm AB TAB SAB Ectopic Multiple Living   2 1  1      1      Review of Systems As in history of present illness Allergies  Ondansetron hcl  Home Medications   Prior to Admission medications   Medication Sig Start Date End Date Taking? Authorizing Provider  acetaminophen (TYLENOL) 500 MG tablet Take 1,000 mg by mouth every 6 (six) hours as needed for mild pain or moderate pain.    Historical  Provider, MD  diphenoxylate-atropine (LOMOTIL) 2.5-0.025 MG per tablet Take 2 tablets by mouth 3 (three) times daily as needed for diarrhea or loose stools. 07/17/14   Linna Hoff, MD  metroNIDAZOLE (FLAGYL) 500 MG tablet Take 1 tablet (500 mg total) by mouth 2 (two) times daily. 11/12/14   Charm Rings, MD  polyethylene glycol [facility]) packet Take 17 g by mouth daily. 06/11/14   Elson Areas, PA-C  Ranitidine HCl (ZANTAC PO) Take 1 tablet by mouth daily as needed (heartburn).    Historical Provider, MD   Meds Ordered and Administered this Visit  Medications - No data to display  BP 136/80 mmHg  Pulse 71  Temp(Src) 98.6 F (37 C) (Oral)  Resp 16  SpO2 100%  LMP 10/20/2014 No data found.   Physical Exam  Constitutional: She is oriented to person, place, and time. She appears well-developed and well-nourished. No distress.  Neck: Neck supple.  Cardiovascular: Normal rate, regular rhythm and normal heart sounds.   No murmur heard. Pulmonary/Chest: Effort normal and breath sounds normal. No respiratory distress. She has no wheezes. She has no rales.  Abdominal: Soft. Bowel sounds are normal. She exhibits no distension. There is no tenderness. There is no rebound and no guarding.  No CVA tenderness  Genitourinary: There is no rash on the right labia. There is no rash on the left labia. No bleeding in the vagina. No  foreign body around the vagina. No signs of injury around the vagina. Vaginal discharge (thin white) found.  Musculoskeletal:  Back: No erythema or edema. No vertebral tenderness or step-offs. No appreciable muscle spasm. No point tenderness.  Neurological: She is alert and oriented to person, place, and time.    ED Course  Procedures (including critical care time)  Labs Review Labs Reviewed  POCT URINALYSIS DIP (DEVICE)  POCT PREGNANCY, URINE  CERVICOVAGINAL ANCILLARY ONLY    Imaging Review No results found.     MDM   1. BV (bacterial vaginosis)    Exam  most consistent with BV. Treat with Flagyl. Swabs sent. Follow up as needed.    Charm Rings, MD 11/12/14 1740

## 2014-11-12 NOTE — Discharge Instructions (Signed)
You likely have BV. Please take Flagyl one pill twice a day for 7 days. We will call you if any of your testing comes back positive. Follow-up as needed.

## 2014-11-12 NOTE — ED Notes (Signed)
C/o abdominal pain and side pain, onset last week

## 2014-11-13 LAB — CERVICOVAGINAL ANCILLARY ONLY
Chlamydia: NEGATIVE
NEISSERIA GONORRHEA: NEGATIVE

## 2014-11-16 LAB — CERVICOVAGINAL ANCILLARY ONLY: WET PREP (BD AFFIRM): POSITIVE — AB

## 2014-11-24 NOTE — ED Notes (Signed)
Final report of STD screening negative for GC, chlamydia, positive for gardnerella. Treatment adequate w Flagyl

## 2016-01-02 ENCOUNTER — Encounter (HOSPITAL_COMMUNITY): Payer: Self-pay

## 2016-01-02 ENCOUNTER — Emergency Department (HOSPITAL_COMMUNITY)
Admission: EM | Admit: 2016-01-02 | Discharge: 2016-01-02 | Disposition: A | Discharge status: Home / Self Care | Payer: Medicaid Other | Attending: Emergency Medicine | Admitting: Emergency Medicine

## 2016-01-02 DIAGNOSIS — K529 Noninfective gastroenteritis and colitis, unspecified: Secondary | ICD-10-CM

## 2016-01-02 DIAGNOSIS — Z79899 Other long term (current) drug therapy: Secondary | ICD-10-CM | POA: Insufficient documentation

## 2016-01-02 DIAGNOSIS — Z87891 Personal history of nicotine dependence: Secondary | ICD-10-CM | POA: Insufficient documentation

## 2016-01-02 HISTORY — DX: Irritable bowel syndrome, unspecified: K58.9

## 2016-01-02 LAB — CBC
HCT: 37.1 % (ref 36.0–46.0)
HEMOGLOBIN: 12.3 g/dL (ref 12.0–15.0)
MCH: 28.1 pg (ref 26.0–34.0)
MCHC: 33.2 g/dL (ref 30.0–36.0)
MCV: 84.7 fL (ref 78.0–100.0)
PLATELETS: 300 10*3/uL (ref 150–400)
RBC: 4.38 MIL/uL (ref 3.87–5.11)
RDW: 12.6 % (ref 11.5–15.5)
WBC: 9.3 10*3/uL (ref 4.0–10.5)

## 2016-01-02 LAB — URINALYSIS, ROUTINE W REFLEX MICROSCOPIC
BILIRUBIN URINE: NEGATIVE
Glucose, UA: NEGATIVE mg/dL
HGB URINE DIPSTICK: NEGATIVE
Ketones, ur: NEGATIVE mg/dL
Leukocytes, UA: NEGATIVE
NITRITE: NEGATIVE
PROTEIN: 30 mg/dL — AB
SPECIFIC GRAVITY, URINE: 1.031 — AB (ref 1.005–1.030)
pH: 7.5 (ref 5.0–8.0)

## 2016-01-02 LAB — COMPREHENSIVE METABOLIC PANEL
ALK PHOS: 72 U/L (ref 38–126)
ALT: 19 U/L (ref 14–54)
ANION GAP: 6 (ref 5–15)
AST: 27 U/L (ref 15–41)
Albumin: 4.1 g/dL (ref 3.5–5.0)
BILIRUBIN TOTAL: 0.4 mg/dL (ref 0.3–1.2)
BUN: 8 mg/dL (ref 6–20)
CALCIUM: 9.1 mg/dL (ref 8.9–10.3)
CO2: 24 mmol/L (ref 22–32)
CREATININE: 0.64 mg/dL (ref 0.44–1.00)
Chloride: 109 mmol/L (ref 101–111)
GFR calc non Af Amer: >60 mL/min (ref >60)
Glucose, Bld: 97 mg/dL (ref 65–99)
Potassium: 3.9 mmol/L (ref 3.5–5.1)
Sodium: 139 mmol/L (ref 135–145)
TOTAL PROTEIN: 7.1 g/dL (ref 6.5–8.1)

## 2016-01-02 LAB — URINE MICROSCOPIC-ADD ON

## 2016-01-02 LAB — LIPASE, BLOOD: Lipase: 25 U/L (ref 11–51)

## 2016-01-02 LAB — I-STAT BETA HCG BLOOD, ED (MC, WL, AP ONLY)

## 2016-01-02 MED ORDER — ONDANSETRON 4 MG PO TBDP
4.0000 mg | ORAL_TABLET | Freq: Once | ORAL | Status: AC
  Administered 2016-01-02: 4 mg via ORAL
  Filled 2016-01-02: qty 1

## 2016-01-02 MED ORDER — ONDANSETRON HCL 4 MG PO TABS: 4.0000 mg | ORAL_TABLET | Freq: Three times a day (TID) | ORAL | 0 refills | Status: DC | PRN

## 2016-01-02 NOTE — ED Provider Notes (Signed)
[facility]-EMERGENCY DEPT Provider Note   CSN: [id number] Arrival date & time: 01/02/16  1350     History   Chief Complaint Chief Complaint  Patient presents with  . Abdominal Pain  . Emesis    HPI Jodi Cochran is a 29 y.o. female with a hx of IBS, GERD, that presents with a one-day history of nausea, vomiting, watery diarrhea. She states that she has had waxing and waning crampy generalized abdominal pain, relieved with bowel movements. She denies any fever, chills, dysuria, urinary frequency, or urgency. Denies vaginal bleeding or discharge. She states that she has begun to feel slightly better than she did this morning. She has not taken anything for her sx. She states that this feels different from her IBS as she normally does not have vomiting with it. She denies any known sick contacts, or recent travel, but states that she has had some suspicious food intake yesterday with a convenience store hot dog. No hx of prior abdominal surgeries.   Patient has zofran listed as allergy but states that this is actually reaction to phenergan. Can take zofran without issue.  HPI  Past Medical History:  Diagnosis Date  . GERD (gastroesophageal reflux disease)   . IBS (irritable bowel syndrome)     Patient Active Problem List   Diagnosis Date Noted  . ESOPHAGEAL REFLUX 04/26/2009  . HEMORRHAGE OF RECTUM AND ANUS 04/26/2009  . ABDOMINAL PAIN, EPIGASTRIC 04/26/2009    Past Surgical History:  Procedure Laterality Date  . NO PAST SURGERIES      OB History    Gravida Para Term Preterm AB Living   2 1   1   1    SAB TAB Ectopic Multiple Live Births           1       Home Medications    Prior to Admission medications   Medication Sig Start Date End Date Taking? Authorizing Provider  ibuprofen (ADVIL,MOTRIN) 200 MG tablet Take 200 mg by mouth every 6 (six) hours as needed for moderate pain.   Yes Historical Provider, MD  Ranitidine HCl (ZANTAC PO) Take 1 tablet by mouth daily as  needed (heartburn).   Yes Historical Provider, MD  diphenoxylate-atropine (LOMOTIL) 2.5-0.025 MG per tablet Take 2 tablets by mouth 3 (three) times daily as needed for diarrhea or loose stools. Patient not taking: Reported on 01/02/2016 07/17/14   Linna Hoff, MD  metroNIDAZOLE (FLAGYL) 500 MG tablet Take 1 tablet (500 mg total) by mouth 2 (two) times daily. Patient not taking: Reported on 01/02/2016 11/12/14   Charm Rings, MD  ondansetron (ZOFRAN) 4 MG tablet Take 1 tablet (4 mg total) by mouth every 8 (eight) hours as needed for nausea or vomiting. 01/02/16   Francoise Ceo, DO  polyethylene glycol [facility]) packet Take 17 g by mouth daily. Patient not taking: Reported on 01/02/2016 06/11/14   Elson Areas, PA-C    Family History No family history on file.  Social History Social History  Substance Use Topics  . Smoking status: Former Smoker    Packs/day: 0.25  . Smokeless tobacco: Not on file  . Alcohol use Yes     Comment: socially     Allergies   Promethazine   Review of Systems Review of Systems  Constitutional: Positive for activity change, appetite change and fatigue. Negative for chills and fever.  HENT: Negative for congestion, rhinorrhea, sinus pressure, sneezing and sore throat.   Respiratory: Negative for cough, chest tightness  and shortness of breath.   Cardiovascular: Negative for chest pain.  Gastrointestinal: Positive for abdominal pain, diarrhea, nausea and vomiting. Negative for blood in stool.  Genitourinary: Negative for dysuria, flank pain, frequency, hematuria, pelvic pain, urgency, vaginal bleeding, vaginal discharge and vaginal pain.  Musculoskeletal: Negative for back pain and neck pain.  Skin: Negative for rash.  Neurological: Negative for syncope, weakness, light-headedness, numbness and headaches.  All other systems reviewed and are negative.    Physical Exam Updated Vital Signs BP 135/74 (BP Location: Right Arm)   Pulse 69   Temp 98.6 F (37  C) (Oral)   Resp 18   Ht 5\' 7"  (1.702 m)   Wt 79.4 kg   SpO2 100%   BMI 27.41 kg/m   Physical Exam  Constitutional: She is oriented to person, place, and time. She appears well-developed and well-nourished. No distress.  Nauseous appearing  HENT:  Head: Normocephalic and atraumatic.  Nose: Nose normal.  Mouth/Throat: Oropharynx is clear and moist.  Eyes: Conjunctivae and EOM are normal. Pupils are equal, round, and reactive to light.  Neck: Neck supple.  Cardiovascular: Normal rate, regular rhythm, normal heart sounds and intact distal pulses.   Pulmonary/Chest: Effort normal and breath sounds normal.  Abdominal: Soft. She exhibits no distension. There is no tenderness.  Musculoskeletal: She exhibits no edema or tenderness.  Neurological: She is alert and oriented to person, place, and time. No cranial nerve deficit. Coordination normal.  Skin: Skin is warm and dry. She is not diaphoretic.  Nursing note and vitals reviewed.    ED Treatments / Results  Labs (all labs ordered are listed, but only abnormal results are displayed) Labs Reviewed  URINALYSIS, ROUTINE W REFLEX MICROSCOPIC (NOT [facility]) - Abnormal; Notable for the following:       Result Value   Color, Urine AMBER (*)    Specific Gravity, Urine 1.031 (*)    Protein, ur 30 (*)    All other components within normal limits  URINE MICROSCOPIC-ADD ON - Abnormal; Notable for the following:    Squamous Epithelial / LPF 0-5 (*)    Bacteria, UA RARE (*)    All other components within normal limits  LIPASE, BLOOD  COMPREHENSIVE METABOLIC PANEL  CBC  I-STAT BETA HCG BLOOD, ED ([facility], [facility], [facility] ONLY)    EKG  EKG Interpretation None       Radiology No results found.  Procedures Procedures (including critical care time)  Medications Ordered in ED Medications  ondansetron (ZOFRAN-ODT) disintegrating tablet 4 mg (4 mg Oral Given 01/02/16 1613)     Initial Impression / Assessment and Plan / ED Course  I have  reviewed the triage vital signs and the nursing notes.  Pertinent labs & imaging results that were available during my care of the patient were reviewed by me and considered in my medical decision making (see chart for details).  Clinical Course   30 y.o. female presents with sx consistent with viral gastroenteritis vs food bourne illness. Exam reassuring, as above with benign abdomen, no clinical concern for dehydration. Labs and urine studies returned showing n significant abnormalities.   She was given a dose of zofran and had symptomatic improvement and was able to tolerate PO without difficulty. She was rx'd the same and was recommended rest, gradual reintroduction of foods, and plenty of fluids. She was recommended to follow up with her PCP in a few days, as needed. Return precautions were given for worsening or concerns. This was discussed with  the patient at the bedside and she stated both understanding and agreement with this plan.   Final Clinical Impressions(s) / ED Diagnoses   Final diagnoses:  Gastroenteritis    New Prescriptions Discharge Medication List as of 01/02/2016  5:07 PM    START taking these medications   Details  ondansetron (ZOFRAN) 4 MG tablet Take 1 tablet (4 mg total) by mouth every 8 (eight) hours as needed for nausea or vomiting., Starting Sun 01/02/2016, Print         Francoise Ceo, DO 01/03/16 1157    Blane Ohara, MD 01/08/16 [phone]

## 2016-01-02 NOTE — ED Triage Notes (Signed)
Patient complains of lower abdominal pain with vomiting and diarrhea since early am. States that she feels weak. Alert and oriented, NAD

## 2016-01-02 NOTE — ED Notes (Signed)
Pt c/o nvd since 1100 this morning 10-22.  Pt says at first the vomit was the food she had previously eating but now just looks like bile.  Pt took an antacid but it did not relieve the nausea and she threw it up. She says "I have threw up too many times to count"

## 2016-01-02 NOTE — ED Notes (Addendum)
Gave pt a Ginger ale, pt does not want any food. Pt states nausea has been relieved.

## 2016-01-02 NOTE — ED Notes (Signed)
Patient verbalized understanding of discharge instructions and denies any further needs or questions at this time. VS stable. Patient ambulatory with steady gait, declined wheelchair. RN escorted to ED entrance.   

## 2016-07-21 ENCOUNTER — Encounter (HOSPITAL_COMMUNITY): Payer: Self-pay | Admitting: Emergency Medicine

## 2016-07-21 ENCOUNTER — Ambulatory Visit (HOSPITAL_COMMUNITY)
Admission: EM | Admit: 2016-07-21 | Discharge: 2016-07-21 | Disposition: A | Discharge status: Home / Self Care | Payer: PRIVATE HEALTH INSURANCE | Attending: Internal Medicine | Admitting: Internal Medicine

## 2016-07-21 DIAGNOSIS — K589 Irritable bowel syndrome without diarrhea: Secondary | ICD-10-CM | POA: Diagnosis not present

## 2016-07-21 MED ORDER — DICYCLOMINE HCL 20 MG PO TABS: 20.0000 mg | ORAL_TABLET | Freq: Two times a day (BID) | ORAL | 2 refills | Status: DC

## 2016-07-21 NOTE — ED Provider Notes (Signed)
CSN: [id number]     Arrival date & time 07/21/16  1119 History   First MD Initiated Contact with Patient 07/21/16 1249     Chief Complaint  Patient presents with  . Abdominal Cramping   (Consider location/radiation/quality/duration/timing/severity/associated sxs/prior Treatment) 31 year old female presents to clinic for evaluation of abdominal cramping and diarrhea. She states she has a past history of irritable bowel syndrome, and that she yesterday at work she ate in mouth and, and some milk, and had a flare up of her condition. She states she is familiar with these symptoms, and normally takes dicyclomine, however she is out of her prescription, and she is requesting another. She has no fever, chills, weakness, dizziness, vomiting, nausea, or any other symptoms.   The history is provided by the patient.    Past Medical History:  Diagnosis Date  . GERD (gastroesophageal reflux disease)   . IBS (irritable bowel syndrome)    Past Surgical History:  Procedure Laterality Date  . NO PAST SURGERIES     History reviewed. No pertinent family history. Social History  Substance Use Topics  . Smoking status: Former Smoker    Packs/day: 0.25  . Smokeless tobacco: Not on file  . Alcohol use Yes     Comment: socially   OB History    Gravida Para Term Preterm AB Living   2 1   1   1    SAB TAB Ectopic Multiple Live Births           1     Review of Systems  Constitutional: Negative.   HENT: Negative.   Respiratory: Negative.   Cardiovascular: Negative.   Gastrointestinal: Positive for abdominal pain and diarrhea. Negative for constipation and vomiting.  Musculoskeletal: Negative.   Skin: Negative.   Neurological: Negative.     Allergies  Promethazine  Home Medications   Prior to Admission medications   Medication Sig Start Date End Date Taking? Authorizing Provider  Ranitidine HCl (ZANTAC PO) Take 1 tablet by mouth daily as needed (heartburn).   Yes [provider]   dicyclomine (BENTYL) 20 MG tablet Take 1 tablet (20 mg total) by mouth 2 (two) times daily. 07/21/16   Dorena BodoKennard, Mikhayla Phillis, NP   Meds Ordered and Administered this Visit  Medications - No data to display  BP (!) 141/88 (BP Location: Right Arm)   Pulse 85   Temp 97.9 F (36.6 C) (Oral)   Resp 18   SpO2 100%  No data found.   Physical Exam  Constitutional: She is oriented to person, place, and time. She appears well-developed and well-nourished.  HENT:  Head: Normocephalic.  Right Ear: External ear normal.  Left Ear: External ear normal.  Eyes: Conjunctivae are normal. Right eye exhibits no discharge. Left eye exhibits no discharge.  Neck: Normal range of motion.  Cardiovascular: Normal rate and regular rhythm.   Pulmonary/Chest: Effort normal and breath sounds normal.  Abdominal: Soft. Bowel sounds are normal. She exhibits no distension. There is no tenderness.  Neurological: She is alert and oriented to person, place, and time.  Skin: Skin is warm. Capillary refill takes less than 2 seconds.  Psychiatric: She has a normal mood and affect. Her behavior is normal.  Nursing note and vitals reviewed.   Urgent Care Course     Procedures (including critical care time)  Labs Review Labs Reviewed - No data to display  Imaging Review No results found.      MDM   1. Irritable bowel syndrome, unspecified type  Patient's dicyclomine refilled, provided counseling on symptom management, provided contact information for community health and wellness, encouraged to contact him to establish for primary care. Follow-up guidelines were provided as well.     Dorena Bodo, NP 07/21/16 [phone]

## 2016-07-21 NOTE — Discharge Instructions (Signed)
I have refilled your dicyclomine. I have also provided the contact information for community health and wellness, I recommend calling them to set up an appointment to establish for primary care. If your symptoms persist, or fail to resolve, return to clinic as needed.

## 2016-07-21 NOTE — ED Triage Notes (Signed)
[facility] with [facility] complaint of abdominal cramping an bloating that started yesterday. The patient reported a history of IBS.

## 2016-12-27 ENCOUNTER — Emergency Department (HOSPITAL_BASED_OUTPATIENT_CLINIC_OR_DEPARTMENT_OTHER)
Admission: EM | Admit: 2016-12-27 | Discharge: 2016-12-27 | Disposition: A | Discharge status: Home / Self Care | Payer: PRIVATE HEALTH INSURANCE | Attending: Emergency Medicine | Admitting: Emergency Medicine

## 2016-12-27 ENCOUNTER — Encounter (HOSPITAL_BASED_OUTPATIENT_CLINIC_OR_DEPARTMENT_OTHER): Payer: Self-pay | Admitting: *Deleted

## 2016-12-27 DIAGNOSIS — G5622 Lesion of ulnar nerve, left upper limb: Secondary | ICD-10-CM

## 2016-12-27 DIAGNOSIS — F17210 Nicotine dependence, cigarettes, uncomplicated: Secondary | ICD-10-CM | POA: Insufficient documentation

## 2016-12-27 DIAGNOSIS — I10 Essential (primary) hypertension: Secondary | ICD-10-CM | POA: Insufficient documentation

## 2016-12-27 DIAGNOSIS — G4489 Other headache syndrome: Secondary | ICD-10-CM | POA: Insufficient documentation

## 2016-12-27 DIAGNOSIS — Z79899 Other long term (current) drug therapy: Secondary | ICD-10-CM | POA: Insufficient documentation

## 2016-12-27 DIAGNOSIS — R0602 Shortness of breath: Secondary | ICD-10-CM | POA: Insufficient documentation

## 2016-12-27 DIAGNOSIS — R42 Dizziness and giddiness: Secondary | ICD-10-CM

## 2016-12-27 MED ORDER — DIPHENHYDRAMINE HCL 50 MG/ML IJ SOLN
25.0000 mg | Freq: Once | INTRAMUSCULAR | Status: DC
Start: 2016-12-27 — End: 2016-12-27

## 2016-12-27 MED ORDER — PROCHLORPERAZINE EDISYLATE 5 MG/ML IJ SOLN: 10.0000 mg | Freq: Once | INTRAMUSCULAR | Status: DC

## 2016-12-27 NOTE — Discharge Instructions (Signed)
Follow up with a primary care provider and a neurologist.  Return for worsening symptoms.

## 2016-12-27 NOTE — ED Triage Notes (Signed)
Pt reports intermittent dizziness and L arm pain/numbness x2wks. [facility] dx [facility] and was instructed to follow-up with PCP. Reports under stress at this time. Reports nausea. Denies v/d.

## 2016-12-27 NOTE — ED Provider Notes (Signed)
[facility] EMERGENCY DEPARTMENT Provider Note   CSN: [id number] Arrival date & time: 12/27/16  [id number]     History   Chief Complaint Chief Complaint  Patient presents with  . Hypertension    HPI Jodi Cochran is a 31 y.o. female.  31 yo F with multiple complaints. Patient has been lightheaded off and on for the past 3 weeks or so. She is unable to fully describe the symptoms. Just thinks that she feels off. She also has been having some tingling to her left forearm. This also seems to come and go. Nothing seems to make this better or worse. She has some shortness of breath when she talks a lot. She denies exertional symptoms. Denies chest pain or pressure. Denies abdominal pain nausea vomiting or diarrhea.She denies head injury. Denies neck pain. Has been having a at at bedtime today but does not want to talk further about that in her history.   The history is provided by the patient.  Hypertension  Associated symptoms include headaches and shortness of breath. Pertinent negatives include no chest pain.  Illness  This is a new problem. The current episode started more than 1 week ago. The problem occurs constantly. The problem has not changed since onset.Associated symptoms include headaches and shortness of breath. Pertinent negatives include no chest pain. Nothing aggravates the symptoms. Nothing relieves the symptoms. She has tried nothing for the symptoms. The treatment provided no relief.    Past Medical History:  Diagnosis Date  . GERD (gastroesophageal reflux disease)   . IBS (irritable bowel syndrome)     Patient Active Problem List   Diagnosis Date Noted  . ESOPHAGEAL REFLUX 04/26/2009  . HEMORRHAGE OF RECTUM AND ANUS 04/26/2009  . ABDOMINAL PAIN, EPIGASTRIC 04/26/2009    Past Surgical History:  Procedure Laterality Date  . NO PAST SURGERIES      OB History    Gravida Para Term Preterm AB Living   2 1   1   1    SAB TAB Ectopic Multiple Live Births             1       Home Medications    Prior to Admission medications   Medication Sig Start Date End Date Taking? Authorizing Provider  Ranitidine HCl (ZANTAC PO) Take 1 tablet by mouth daily as needed (heartburn).   Yes [provider]  dicyclomine (BENTYL) 20 MG tablet Take 1 tablet (20 mg total) by mouth 2 (two) times daily. 07/21/16   Dorena BodoKennard, Lawrence, NP    Family History No family history on file.  Social History Social History  Substance Use Topics  . Smoking status: Current Some Day Smoker    Packs/day: 0.25  . Smokeless tobacco: Never Used  . Alcohol use Yes     Comment: socially     Allergies   Flagyl [metronidazole] and Promethazine   Review of Systems Review of Systems  Constitutional: Negative for chills and fever.  HENT: Negative for congestion and rhinorrhea.   Eyes: Negative for redness and visual disturbance.  Respiratory: Positive for shortness of breath. Negative for wheezing.   Cardiovascular: Negative for chest pain and palpitations.  Gastrointestinal: Negative for nausea and vomiting.  Genitourinary: Negative for dysuria and urgency.  Musculoskeletal: Positive for arthralgias and myalgias.  Skin: Negative for pallor and wound.  Neurological: Positive for light-headedness and headaches. Negative for dizziness.  Psychiatric/Behavioral: Positive for dysphoric mood.     Physical Exam Updated Vital Signs BP (!) 141/93  Pulse 70   Temp 98.4 F (36.9 C) (Oral)   Resp 19   Ht 5\' 5"  (1.651 m)   Wt 76.2 kg (168 lb)   LMP 12/06/2016   SpO2 100%   BMI 27.96 kg/m   Physical Exam  Constitutional: She is oriented to person, place, and time. She appears well-developed and well-nourished. No distress.  HENT:  Head: Normocephalic and atraumatic.  Eyes: Pupils are equal, round, and reactive to light. EOM are normal.  Neck: Normal range of motion. Neck supple.  Cardiovascular: Normal rate and regular rhythm.  Exam reveals no gallop and  no friction rub.   No murmur heard. Pulmonary/Chest: Effort normal. She has no wheezes. She has no rales.  Abdominal: Soft. She exhibits no distension and no mass. There is no tenderness. There is no guarding.  Musculoskeletal: She exhibits no edema or tenderness.  Neurological: She is alert and oriented to person, place, and time. She has normal strength. No cranial nerve deficit or sensory deficit. She displays a negative Romberg sign. Coordination and gait normal. GCS eye subscore is 4. GCS verbal subscore is 5. GCS motor subscore is 6.  Reflex Scores:      Tricep reflexes are 2+ on the right side and 2+ on the left side.      Bicep reflexes are 2+ on the right side and 2+ on the left side.      Brachioradialis reflexes are 2+ on the right side and 2+ on the left side.      Patellar reflexes are 2+ on the right side and 2+ on the left side.      Achilles reflexes are 2+ on the right side and 2+ on the left side. Tinel's test to the ulnar canal at the left elbow reproduces her paresthesias. Pulse motor and sensation is intact to the left upper extremity  Skin: Skin is warm and dry. She is not diaphoretic.  Psychiatric: She has a normal mood and affect. Her behavior is normal.  Nursing note and vitals reviewed.    ED Treatments / Results  Labs (all labs ordered are listed, but only abnormal results are displayed) Labs Reviewed - No data to display  EKG  EKG Interpretation None       Radiology No results found.  Procedures Procedures (including critical care time)  Medications Ordered in ED Medications  prochlorperazine (COMPAZINE) injection 10 mg (10 mg Intramuscular Not Given 12/27/16 0846)  diphenhydrAMINE (BENADRYL) injection 25 mg (25 mg Intramuscular Not Given 12/27/16 0847)     Initial Impression / Assessment and Plan / ED Course  I have reviewed the triage vital signs and the nursing notes.  Pertinent labs & imaging results that were available during my care of  the patient were reviewed by me and considered in my medical decision making (see chart for details).     31 yo F with multiple complaints. I'm unable to find a unifying diagnosis for all of them, I doubt that labs are necessary at this time as the patient has no prior medical history and is not on medications. She has a benign neuro exam. With her headache is possible that she is having a complicated migraine with paresthesias. The paresthesias are reproducible with percussion of the ulnar canal.I offered a headache cocktail which she refused. She did ask for a workup prior to leaving. I suggested she follow with her PCP. I also talked to her about quitting smoking. She became a little irritated when I suggest she  stop smoking. She felt that it did not do anything for her today. I again suggested that she follow-up with her primary care physician. I also referred her to neurology for a possible new onset headache syndrome  Discussed smoking cessation with patient and was they were offerred resources to help stop.  Total time was 5 min CPT code 16109.    9:55 AM:  I have discussed the diagnosis/risks/treatment options with the patient and believe the pt to be eligible for discharge home to follow-up with PCP. We also discussed returning to the ED immediately if new or worsening sx occur. We discussed the sx which are most concerning (e.g., stroke s/sx, exertional cp, sob) that necessitate immediate return. Medications administered to the patient during their visit and any new prescriptions provided to the patient are listed below.  Medications given during this visit Medications  prochlorperazine (COMPAZINE) injection 10 mg (10 mg Intramuscular Not Given 12/27/16 0846)  diphenhydrAMINE (BENADRYL) injection 25 mg (25 mg Intramuscular Not Given 12/27/16 0847)     The patient appears reasonably screen and/or stabilized for discharge and I doubt any other medical condition or other [facility] requiring  further screening, evaluation, or treatment in the ED at this time prior to discharge.    Final Clinical Impressions(s) / ED Diagnoses   Final diagnoses:  Headache syndrome  Ulnar neuropathy at elbow of left upper extremity  Lightheadedness    New Prescriptions Discharge Medication List as of 12/27/2016  8:50 AM       Melene Plan, DO 12/27/16 6045

## 2017-01-01 ENCOUNTER — Ambulatory Visit (INDEPENDENT_AMBULATORY_CARE_PROVIDER_SITE_OTHER): Payer: No Typology Code available for payment source | Admitting: Family Medicine

## 2017-01-01 ENCOUNTER — Encounter: Payer: Self-pay | Admitting: Family Medicine

## 2017-01-01 VITALS — BP 142/88 | HR 88 | Wt 169.6 lb

## 2017-01-01 DIAGNOSIS — Z7689 Persons encountering health services in other specified circumstances: Secondary | ICD-10-CM | POA: Diagnosis not present

## 2017-01-01 DIAGNOSIS — R030 Elevated blood-pressure reading, without diagnosis of hypertension: Secondary | ICD-10-CM

## 2017-01-01 DIAGNOSIS — K219 Gastro-esophageal reflux disease without esophagitis: Secondary | ICD-10-CM

## 2017-01-01 DIAGNOSIS — Z23 Encounter for immunization: Secondary | ICD-10-CM

## 2017-01-01 DIAGNOSIS — F418 Other specified anxiety disorders: Secondary | ICD-10-CM

## 2017-01-01 LAB — COMPREHENSIVE METABOLIC PANEL
AG Ratio: 1.7 (calc) (ref 1.0–2.5)
ALBUMIN MSPROF: 4.1 g/dL (ref 3.6–5.1)
ALKALINE PHOSPHATASE (APISO): 60 U/L (ref 33–115)
ALT: 10 U/L (ref 6–29)
AST: 15 U/L (ref 10–30)
BUN: 16 mg/dL (ref 7–25)
CHLORIDE: 108 mmol/L (ref 98–110)
CO2: 24 mmol/L (ref 20–32)
CREATININE: 0.69 mg/dL (ref 0.50–1.10)
Calcium: 9.5 mg/dL (ref 8.6–10.2)
Globulin: 2.4 g/dL (calc) (ref 1.9–3.7)
Glucose, Bld: 101 mg/dL — ABNORMAL HIGH (ref 65–99)
POTASSIUM: 3.8 mmol/L (ref 3.5–5.3)
Sodium: 139 mmol/L (ref 135–146)
Total Bilirubin: 0.3 mg/dL (ref 0.2–1.2)
Total Protein: 6.5 g/dL (ref 6.1–8.1)

## 2017-01-01 LAB — CBC WITH DIFFERENTIAL/PLATELET
Basophils Absolute: 41 cells/uL (ref 0–200)
Basophils Relative: 0.6 %
Eosinophils Absolute: 621 cells/uL — ABNORMAL HIGH (ref 15–500)
Eosinophils Relative: 9 %
HEMATOCRIT: 33.4 % — AB (ref 35.0–45.0)
Hemoglobin: 11.4 g/dL — ABNORMAL LOW (ref 11.7–15.5)
LYMPHS ABS: 2339 {cells}/uL (ref 850–3900)
MCH: 28.5 pg (ref 27.0–33.0)
MCHC: 34.1 g/dL (ref 32.0–36.0)
MCV: 83.5 fL (ref 80.0–100.0)
MPV: 9.8 fL (ref 7.5–12.5)
Monocytes Relative: 6.1 %
Neutro Abs: 3478 cells/uL (ref 1500–7800)
Neutrophils Relative %: 50.4 %
PLATELETS: 313 10*3/uL (ref 140–400)
RBC: 4 10*6/uL (ref 3.80–5.10)
RDW: 13 % (ref 11.0–15.0)
Total Lymphocyte: 33.9 %
WBC: 6.9 10*3/uL (ref 3.8–10.8)
WBCMIX: 421 {cells}/uL (ref 200–950)

## 2017-01-01 LAB — TSH: TSH: 1.16 m[IU]/L

## 2017-01-01 NOTE — Patient Instructions (Addendum)
Buy a blood pressure cuff and start checking your BP at home.  Write down the readings.  Bring the cuff and readings in to your next appointment in 2-3 weeks.  Watch your salt or sodium intake and cut back if you are eating a lot of salty foods. This can increase your blood pressure.   I recommend that you call and schedule an appointment with a counselor to help with anxiety and stress.   Keep an eye on any foods or drinks that may your reflux or abdominal symptoms worse.   We will call you with lab results.    A few offices are listed below for you to call for counseling. You can check with your insurance company about counselors as well.   [facility]  [address], [place], [place] [address]  Phone: [phone]  [facility] [address] [place], [place] [address]  Phone: [phone]  [facility]  [address], [place], [place] [address] Phone: [phone]        DASH Eating Plan DASH stands for "Dietary Approaches to Stop Hypertension." The DASH eating plan is a healthy eating plan that has been shown to reduce high blood pressure (hypertension). It may also reduce your risk for type 2 diabetes, heart disease, and stroke. The DASH eating plan may also help with weight loss. What are tips for following this plan? General guidelines  Avoid eating more than 2,300 mg (milligrams) of salt (sodium) a day. If you have hypertension, you may need to reduce your sodium intake to 1,500 mg a day.  Limit alcohol intake to no more than 1 drink a day for nonpregnant women and 2 drinks a day for men. One drink equals 12 oz of beer, 5 oz of wine, or 1 oz of hard liquor.  Work with your health care provider to maintain a healthy body weight or to lose weight. Ask what an ideal weight is for you.  Get at least 30 minutes of exercise that causes your heart to beat faster (aerobic  exercise) most days of the week. Activities may include walking, swimming, or biking.  Work with your health care provider or diet and nutrition specialist (dietitian) to adjust your eating plan to your individual calorie needs. Reading food labels  Check food labels for the amount of sodium per serving. Choose foods with less than 5 percent of the Daily Value of sodium. Generally, foods with less than 300 mg of sodium per serving fit into this eating plan.  To find whole grains, look for the word "whole" as the first word in the ingredient list. Shopping  Buy products labeled as "low-sodium" or "no salt added."  Buy fresh foods. Avoid canned foods and premade or frozen meals. Cooking  Avoid adding salt when cooking. Use salt-free seasonings or herbs instead of table salt or sea salt. Check with your health care provider or pharmacist before using salt substitutes.  Do not fry foods. Cook foods using healthy methods such as baking, boiling, grilling, and broiling instead.  Cook with heart-healthy oils, such as olive, canola, soybean, or sunflower oil. Meal planning   Eat a balanced diet that includes: ? 5 or more servings of fruits and vegetables each day. At each meal, try to fill half of your plate with fruits and vegetables. ? Up to 6-8 servings of whole grains each day. ? Less than 6 oz of lean meat,  poultry, or fish each day. A 3-oz serving of meat is about the same size as a deck of cards. One egg equals 1 oz. ? 2 servings of low-fat dairy each day. ? A serving of nuts, seeds, or beans 5 times each week. ? Heart-healthy fats. Healthy fats called Omega-3 fatty acids are found in foods such as flaxseeds and coldwater fish, like sardines, salmon, and mackerel.  Limit how much you eat of the following: ? Canned or prepackaged foods. ? Food that is high in trans fat, such as fried foods. ? Food that is high in saturated fat, such as fatty meat. ? Sweets, desserts, sugary drinks,  and other foods with added sugar. ? Full-fat dairy products.  Do not salt foods before eating.  Try to eat at least 2 vegetarian meals each week.  Eat more home-cooked food and less restaurant, buffet, and fast food.  When eating at a restaurant, ask that your food be prepared with less salt or no salt, if possible. What foods are recommended? The items listed may not be a complete list. Talk with your dietitian about what dietary choices are best for you. Grains Whole-grain or whole-wheat bread. Whole-grain or whole-wheat pasta. Brown rice. [place]. Bulgur. Whole-grain and low-sodium cereals. Pita bread. Low-fat, low-sodium crackers. Whole-wheat flour tortillas. Vegetables Fresh or frozen vegetables (raw, steamed, roasted, or grilled). Low-sodium or reduced-sodium tomato and vegetable juice. Low-sodium or reduced-sodium tomato sauce and tomato paste. Low-sodium or reduced-sodium canned vegetables. Fruits All fresh, dried, or frozen fruit. Canned fruit in natural juice (without added sugar). Meat and other protein foods Skinless chicken or [place]. Ground chicken or [place]. Pork with fat trimmed off. Fish and seafood. Egg whites. Dried beans, peas, or lentils. Unsalted nuts, nut butters, and seeds. Unsalted canned beans. Lean cuts of beef with fat trimmed off. Low-sodium, lean deli meat. Dairy Low-fat (1%) or fat-free (skim) milk. Fat-free, low-fat, or reduced-fat cheeses. Nonfat, low-sodium ricotta or cottage cheese. Low-fat or nonfat yogurt. Low-fat, low-sodium cheese. Fats and oils Soft margarine without trans fats. Vegetable oil. Low-fat, reduced-fat, or light mayonnaise and salad dressings (reduced-sodium). Canola, safflower, olive, soybean, and sunflower oils. Avocado. Seasoning and other foods Herbs. Spices. Seasoning mixes without salt. Unsalted popcorn and pretzels. Fat-free sweets. What foods are not recommended? The items listed may not be a complete list. Talk with your  dietitian about what dietary choices are best for you. Grains Baked goods made with fat, such as croissants, muffins, or some breads. Dry pasta or rice meal packs. Vegetables Creamed or fried vegetables. Vegetables in a cheese sauce. Regular canned vegetables (not low-sodium or reduced-sodium). Regular canned tomato sauce and paste (not low-sodium or reduced-sodium). Regular tomato and vegetable juice (not low-sodium or reduced-sodium). Angie Fava. Olives. Fruits Canned fruit in a light or heavy syrup. Fried fruit. Fruit in cream or butter sauce. Meat and other protein foods Fatty cuts of meat. Ribs. Fried meat. Berniece Salines. Sausage. Bologna and other processed lunch meats. Salami. Fatback. Hotdogs. Bratwurst. Salted nuts and seeds. Canned beans with added salt. Canned or smoked fish. Whole eggs or egg yolks. Chicken or [place] with skin. Dairy Whole or 2% milk, cream, and half-and-half. Whole or full-fat cream cheese. Whole-fat or sweetened yogurt. Full-fat cheese. Nondairy creamers. Whipped toppings. Processed cheese and cheese spreads. Fats and oils Butter. Stick margarine. Lard. Shortening. Ghee. Bacon fat. Tropical oils, such as coconut, palm kernel, or palm oil. Seasoning and other foods Salted popcorn and pretzels. Onion salt, garlic salt, seasoned salt, table salt, and  sea salt. Worcestershire sauce. Tartar sauce. Barbecue sauce. Teriyaki sauce. Soy sauce, including reduced-sodium. Steak sauce. Canned and packaged gravies. Fish sauce. Oyster sauce. Cocktail sauce. Horseradish that you find on the shelf. Ketchup. Mustard. Meat flavorings and tenderizers. Bouillon cubes. Hot sauce and Tabasco sauce. Premade or packaged marinades. Premade or packaged taco seasonings. Relishes. Regular salad dressings. Where to find more information:  National Heart, Lung, and [place]: https://wilson-eaton.com/  American Heart Association: www.heart.org Summary  The DASH eating plan is a healthy eating plan that has  been shown to reduce high blood pressure (hypertension). It may also reduce your risk for type 2 diabetes, heart disease, and stroke.  With the DASH eating plan, you should limit salt (sodium) intake to 2,300 mg a day. If you have hypertension, you may need to reduce your sodium intake to 1,500 mg a day.  When on the DASH eating plan, aim to eat more fresh fruits and vegetables, whole grains, lean proteins, low-fat dairy, and heart-healthy fats.  Work with your health care provider or diet and nutrition specialist (dietitian) to adjust your eating plan to your individual calorie needs. This information is not intended to replace advice given to you by your health care provider. Make sure you discuss any questions you have with your health care provider. Document Released: 02/16/2011 Document Revised: 02/21/2016 Document Reviewed: 02/21/2016 Elsevier Interactive Patient Education  2017 [employer].

## 2017-01-01 NOTE — Progress Notes (Signed)
   Subjective:    Patient ID: Jodi Cochran, female    DOB: 04/30/1985, 31 y.o.   MRN: [id number]  HPI Chief Complaint  Patient presents with  . new pt    new pt anxiety issues. had issues about 8-9 years ago   She is new to the practice.  No PCP in almost 2 years.   Complains of gestational hypertension 9 years ago. Denies having history of HTN. States last week she was seen at an urgent care for dizziness. She was told her BP was elevated. She does not check her BP at home.   Reports intermittent dizziness but none today.  Reports history of anxiety and stress over the past several years that results in her "breaking down for a couple of days each month. States the rest of the month she is fine. Denies  seeing a therapist in the past but is willing.   Has a female partner and a 31 year old child.  Works at [employer].    Reports anxiety and depression runs in the family.   Denies feeling depressed. No SI or HI.   History of reflux. Does not avoid food triggers. Drinks alcohol nightly. Takes ranitidine.   Denies fever, chills, dizziness, chest pain, palpitations, shortness of breath, abdominal pain, N/V/D, urinary symptoms, LE edema.    Depression screen PHQ 2/9 01/01/2017  Decreased Interest 1  Down, Depressed, Hopeless 0  PHQ - 2 Score 1     Review of Systems Pertinent positives and negatives in the history of present illness.     Objective:   Physical Exam  Constitutional: She is oriented to person, place, and time. She appears well-developed and well-nourished. No distress.  HENT:  Mouth/Throat: Oropharynx is clear and moist.  Eyes: Pupils are equal, round, and reactive to light. Conjunctivae are normal.  Neck: Normal range of motion. Neck supple. No JVD present. No thyromegaly present.  Cardiovascular: Normal rate, regular rhythm, normal heart sounds and intact distal pulses.   Pulmonary/Chest: Effort normal and breath sounds normal.  Lymphadenopathy:   She has no cervical adenopathy.  Neurological: She is alert and oriented to person, place, and time. She has normal reflexes. No cranial nerve deficit or sensory deficit. Gait normal.  No LE edema  Skin: Skin is warm and dry. No pallor.  Psychiatric: She has a normal mood and affect. Her speech is normal and behavior is normal. Thought content normal.    BP (!) 142/88   Pulse 88   Wt 169 lb 9.6 oz (76.9 kg)   LMP 12/30/2016   BMI 28.22 kg/m       Assessment & Plan:  Blood pressure elevated without history of HTN - Plan: CBC with Differential/Platelet, Comprehensive metabolic panel, TSH  Situational anxiety - Plan: CBC with Differential/Platelet, Comprehensive metabolic panel, TSH  Gastroesophageal reflux disease, esophagitis presence not specified  Encounter to establish care  Needs flu shot - Plan: Flu Vaccine QUAD 36+ mos IM  Recommend she buy a BP cuff and start checking her BP at home and record her readings.  Counseled on DASH diet and healthy lifestyle to reduce BP.  Recommend she return with her BP cuff and readings in 2-3 weeks. She may also have a fasting CPE at that time if she chooses.  Recommend counseling for situational stress and anxiety.  Advised to avoid food triggers and alcohol for reflux. Continue Zantac or may switch to a PPI if needed.  Flu shot given.

## 2017-01-09 ENCOUNTER — Ambulatory Visit (INDEPENDENT_AMBULATORY_CARE_PROVIDER_SITE_OTHER): Payer: No Typology Code available for payment source | Admitting: Family Medicine

## 2017-01-09 ENCOUNTER — Encounter: Payer: Self-pay | Admitting: Family Medicine

## 2017-01-09 VITALS — BP 120/80 | HR 78 | Temp 98.5°F | Resp 16 | Wt 168.0 lb

## 2017-01-09 DIAGNOSIS — R059 Cough, unspecified: Secondary | ICD-10-CM

## 2017-01-09 DIAGNOSIS — J069 Acute upper respiratory infection, unspecified: Secondary | ICD-10-CM

## 2017-01-09 DIAGNOSIS — R05 Cough: Secondary | ICD-10-CM

## 2017-01-09 NOTE — Patient Instructions (Signed)
I suspect your symptoms are related to a viral illness and recommend treating your symptoms at this point.  Mucinex DM for congestion and cough, drink extra water, use salt water gargles for throat irritation and Tylenol or Ibuprofen for aches and pains.  Call if you are not improving in 2-3 days or if you develop fever, wheezing or worsening symptoms.

## 2017-01-09 NOTE — Progress Notes (Signed)
Chief Complaint  Patient presents with  . sick    sick- got the flu shot last week here and has been sick since, cough, sore throat, in the morning feels like a fever, achy, chills, drainage, ear pain   Subjective:  Jodi Cochran is a 31 y.o. female who presents for a 7 day history of body aches, feeling feverish, chills, hoarseness, sore throat. and right ear pain. States she now has a productive cough with yellowish sputum. States overall she feels approximately 60% improved from onset of illness.   Denies chest pain, shortness of breath, wheezing, vomiting, diarrhea.   States she did get her flu shot 1 day prior to symptoms.   Treatment to date: Dayquil, ibuprofen.  Denies sick contacts.  No other aggravating or relieving factors.  No other c/o.  ROS as in subjective.   Objective: Vitals:   01/09/17 1400  BP: 120/80  Pulse: 78  Resp: 16  Temp: 98.5 F (36.9 C)  SpO2: 97%    General appearance: Alert, WD/WN, no distress, mildly ill appearing, hoarse voice                             Skin: warm, no rash                           Head: no sinus tenderness                            Eyes: conjunctiva normal, corneas clear, PERRLA                            Ears: pearly TMs, external ear canals normal                          Nose: septum midline, turbinates swollen, with erythema and no discharge             Mouth/throat: MMM, tongue normal, mild pharyngeal erythema                           Neck: supple, no adenopathy, no thyromegaly, nontender                          Heart: RRR, normal S1, S2, no murmurs                         Lungs: CTA bilaterally, no wheezes, rales, or rhonchi      Assessment: Acute URI  Cough   Plan: Discussed diagnosis and treatment of URI. Discussed her symptoms suggest a viral etiology and since she is at least 60% improved we will hold off on antibiotics.  Suggested symptomatic OTC remedies, rest, hydrate and salt water gargles. Muxinex for  congestion and cough. Nasal saline spray for nasal congestion.  Tylenol or Ibuprofen OTC for fever and malaise.  Call/return in 2-3 days if symptoms aren't resolving.

## 2017-01-18 ENCOUNTER — Encounter: Payer: No Typology Code available for payment source | Admitting: Family Medicine

## 2017-01-30 ENCOUNTER — Other Ambulatory Visit (HOSPITAL_COMMUNITY)
Admission: RE | Admit: 2017-01-30 | Discharge: 2017-01-30 | Disposition: A | Discharge status: Home / Self Care | Payer: PRIVATE HEALTH INSURANCE | Source: Ambulatory Visit | Attending: Family Medicine | Admitting: Family Medicine

## 2017-01-30 ENCOUNTER — Encounter: Payer: Self-pay | Admitting: Family Medicine

## 2017-01-30 ENCOUNTER — Ambulatory Visit (INDEPENDENT_AMBULATORY_CARE_PROVIDER_SITE_OTHER): Payer: No Typology Code available for payment source | Admitting: Family Medicine

## 2017-01-30 VITALS — BP 118/78 | HR 73 | Ht 67.0 in | Wt 165.2 lb

## 2017-01-30 DIAGNOSIS — Z7189 Other specified counseling: Secondary | ICD-10-CM | POA: Diagnosis not present

## 2017-01-30 DIAGNOSIS — Z23 Encounter for immunization: Secondary | ICD-10-CM

## 2017-01-30 DIAGNOSIS — Z0000 Encounter for general adult medical examination without abnormal findings: Secondary | ICD-10-CM | POA: Diagnosis not present

## 2017-01-30 DIAGNOSIS — R87810 Cervical high risk human papillomavirus (HPV) DNA test positive: Secondary | ICD-10-CM | POA: Diagnosis not present

## 2017-01-30 DIAGNOSIS — Z124 Encounter for screening for malignant neoplasm of cervix: Secondary | ICD-10-CM

## 2017-01-30 DIAGNOSIS — Z7185 Encounter for immunization safety counseling: Secondary | ICD-10-CM

## 2017-01-30 LAB — CBC WITH DIFFERENTIAL/PLATELET
BASOS PCT: 0.5 %
Basophils Absolute: 38 cells/uL (ref 0–200)
Eosinophils Absolute: 296 cells/uL (ref 15–500)
Eosinophils Relative: 3.9 %
HCT: 35.2 % (ref 35.0–45.0)
Hemoglobin: 12.2 g/dL (ref 11.7–15.5)
LYMPHS ABS: 2911 {cells}/uL (ref 850–3900)
MCH: 28.7 pg (ref 27.0–33.0)
MCHC: 34.7 g/dL (ref 32.0–36.0)
MCV: 82.8 fL (ref 80.0–100.0)
MPV: 10 fL (ref 7.5–12.5)
Monocytes Relative: 5 %
Neutro Abs: 3975 cells/uL (ref 1500–7800)
Neutrophils Relative %: 52.3 %
Platelets: 315 10*3/uL (ref 140–400)
RBC: 4.25 10*6/uL (ref 3.80–5.10)
RDW: 12.6 % (ref 11.0–15.0)
TOTAL LYMPHOCYTE: 38.3 %
WBC: 7.6 10*3/uL (ref 3.8–10.8)
WBCMIX: 380 {cells}/uL (ref 200–950)

## 2017-01-30 LAB — POCT URINALYSIS DIP (PROADVANTAGE DEVICE)
Bilirubin, UA: NEGATIVE
GLUCOSE UA: NEGATIVE mg/dL
Ketones, POC UA: NEGATIVE mg/dL
Leukocytes, UA: NEGATIVE
NITRITE UA: NEGATIVE
Protein Ur, POC: NEGATIVE mg/dL
RBC UA: NEGATIVE
Specific Gravity, Urine: 1.03
UUROB: NEGATIVE
pH, UA: 6 (ref 5.0–8.0)

## 2017-01-30 LAB — LIPID PANEL
Cholesterol: 192 mg/dL (ref <200)
HDL: 64 mg/dL (ref >50)
LDL CHOLESTEROL (CALC): 113 mg/dL — AB
Non-HDL Cholesterol (Calc): 128 mg/dL (calc) (ref <130)
TRIGLYCERIDES: 68 mg/dL (ref <150)
Total CHOL/HDL Ratio: 3 (calc) (ref <5.0)

## 2017-01-30 NOTE — Patient Instructions (Addendum)
Start getting at least 150 minutes of physical activity per week.  We will call you with your lab results.   Preventative Care for Adults - Female      MAINTAIN REGULAR HEALTH EXAMS:  A routine yearly physical is a good way to check in with your primary care provider about your health and preventive screening. It is also an opportunity to share updates about your health and any concerns you have, and receive a thorough all-over exam.   Most health insurance companies pay for at least some preventative services.  Check with your health plan for specific coverages.  WHAT PREVENTATIVE SERVICES DO WOMEN NEED?  Adult women should have their weight and blood pressure checked regularly.   Women age 35 and older should have their cholesterol levels checked regularly.  Women should be screened for cervical cancer with a Pap smear and pelvic exam beginning at either age 21, or 3 years after they become sexually activity.    Breast cancer screening generally begins at age 40 with a mammogram and breast exam by your primary care provider.    Beginning at age 50 and continuing to age 75, women should be screened for colorectal cancer.  Certain people may need continued testing until age 85.  Updating vaccinations is part of preventative care.  Vaccinations help protect against diseases such as the flu.  Osteoporosis is a disease in which the bones lose minerals and strength as we age. Women ages 65 and over should discuss this with their caregivers, as should women after menopause who have other risk factors.  Lab tests are generally done as part of preventative care to screen for anemia and blood disorders, to screen for problems with the kidneys and liver, to screen for bladder problems, to check blood sugar, and to check your cholesterol level.  Preventative services generally include counseling about diet, exercise, avoiding tobacco, drugs, excessive alcohol consumption, and sexually transmitted  infections.    GENERAL RECOMMENDATIONS FOR GOOD HEALTH:  Healthy diet:  Eat a variety of foods, including fruit, vegetables, animal or vegetable protein, such as meat, fish, chicken, and eggs, or beans, lentils, tofu, and grains, such as rice.  Drink plenty of water daily.  Decrease saturated fat in the diet, avoid lots of red meat, processed foods, sweets, fast foods, and fried foods.  Exercise:  Aerobic exercise helps maintain good heart health. At least 30-40 minutes of moderate-intensity exercise is recommended. For example, a brisk walk that increases your heart rate and breathing. This should be done on most days of the week.   Find a type of exercise or a variety of exercises that you enjoy so that it becomes a part of your daily life.  Examples are running, walking, swimming, water aerobics, and biking.  For motivation and support, explore group exercise such as aerobic class, spin class, Zumba, Yoga,or  martial arts, etc.    Set exercise goals for yourself, such as a certain weight goal, walk or run in a race such as a 5k walk/run.  Speak to your primary care provider about exercise goals.  Disease prevention:  If you smoke or chew tobacco, find out from your caregiver how to quit. It can literally save your life, no matter how long you have been a tobacco user. If you do not use tobacco, never begin.   Maintain a healthy diet and normal weight. Increased weight leads to problems with blood pressure and diabetes.   The Body Mass Index or BMI is   a way of measuring how much of your body is fat. Having a BMI above 27 increases the risk of heart disease, diabetes, hypertension, stroke and other problems related to obesity. Your caregiver can help determine your BMI and based on it develop an exercise and dietary program to help you achieve or maintain this important measurement at a healthful level.  High blood pressure causes heart and blood vessel problems.  Persistent high blood  pressure should be treated with medicine if weight loss and exercise do not work.   Fat and cholesterol leaves deposits in your arteries that can block them. This causes heart disease and vessel disease elsewhere in your body.  If your cholesterol is found to be high, or if you have heart disease or certain other medical conditions, then you may need to have your cholesterol monitored frequently and be treated with medication.   Ask if you should have a cardiac stress test if your history suggests this. A stress test is a test done on a treadmill that looks for heart disease. This test can find disease prior to there being a problem.  Menopause can be associated with physical symptoms and risks. Hormone replacement therapy is available to decrease these. You should talk to your caregiver about whether starting or continuing to take hormones is right for you.   Osteoporosis is a disease in which the bones lose minerals and strength as we age. This can result in serious bone fractures. Risk of osteoporosis can be identified using a bone density scan. Women ages 65 and over should discuss this with their caregivers, as should women after menopause who have other risk factors. Ask your caregiver whether you should be taking a calcium supplement and Vitamin D, to reduce the rate of osteoporosis.   Avoid drinking alcohol in excess (more than two drinks per day).  Avoid use of street drugs. Do not share needles with anyone. Ask for professional help if you need assistance or instructions on stopping the use of alcohol, cigarettes, and/or drugs.  Brush your teeth twice a day with fluoride toothpaste, and floss once a day. Good oral hygiene prevents tooth decay and gum disease. The problems can be painful, unattractive, and can cause other health problems. Visit your dentist for a routine oral and dental check up and preventive care every 6-12 months.   Look at your skin regularly.  Use a mirror to look at your  back. Notify your caregivers of changes in moles, especially if there are changes in shapes, colors, a size larger than a pencil eraser, an irregular border, or development of new moles.  Safety:  Use seatbelts 100% of the time, whether driving or as a passenger.  Use safety devices such as hearing protection if you work in environments with loud noise or significant background noise.  Use safety glasses when doing any work that could send debris in to the eyes.  Use a helmet if you ride a bike or motorcycle.  Use appropriate safety gear for contact sports.  Talk to your caregiver about gun safety.  Use sunscreen with a SPF (or skin protection factor) of 15 or greater.  Lighter skinned people are at a greater risk of skin cancer. Don't forget to also wear sunglasses in order to protect your eyes from too much damaging sunlight. Damaging sunlight can accelerate cataract formation.   Practice safe sex. Use condoms. Condoms are used for birth control and to help reduce the spread of sexually transmitted infections (or STIs).    Some of the STIs are gonorrhea (the clap), chlamydia, syphilis, trichomonas, herpes, HPV (human papilloma virus) and HIV (human immunodeficiency virus) which causes AIDS. The herpes, HIV and HPV are viral illnesses that have no cure. These can result in disability, cancer and death.   Keep carbon monoxide and smoke detectors in your home functioning at all times. Change the batteries every 6 months or use a model that plugs into the wall.   Vaccinations:  Stay up to date with your tetanus shots and other required immunizations. You should have a booster for tetanus every 10 years. Be sure to get your flu shot every year, since 5%-20% of the [place] population comes down with the flu. The flu vaccine changes each year, so being vaccinated once is not enough. Get your shot in the fall, before the flu season peaks.   Other vaccines to consider:  Human Papilloma Virus or HPV causes  cancer of the cervix, and other infections that can be transmitted from person to person. There is a vaccine for HPV, and females should get immunized between the ages of 11 and 26. It requires a series of 3 shots.   Pneumococcal vaccine to protect against certain types of pneumonia.  This is normally recommended for adults age 65 or older.  However, adults younger than 31 years old with certain underlying conditions such as diabetes, heart or lung disease should also receive the vaccine.  Shingles vaccine to protect against Varicella Zoster if you are older than age 60, or younger than 31 years old with certain underlying illness.  Hepatitis A vaccine to protect against a form of infection of the liver by a virus acquired from food.  Hepatitis B vaccine to protect against a form of infection of the liver by a virus acquired from blood or body fluids, particularly if you work in health care.  If you plan to travel internationally, check with your local health department for specific vaccination recommendations.  Cancer Screening:  Breast cancer screening is essential to preventive care for women. All women age 20 and older should perform a breast self-exam every month. At age 40 and older, women should have their caregiver complete a breast exam each year. Women at ages 40 and older should have a mammogram (x-ray film) of the breasts. Your caregiver can discuss how often you need mammograms.    Cervical cancer screening includes taking a Pap smear (sample of cells examined under a microscope) from the cervix (end of the uterus). It also includes testing for HPV (Human Papilloma Virus, which can cause cervical cancer). Screening and a pelvic exam should begin at age 21, or 3 years after a woman becomes sexually active. Screening should occur every year, with a Pap smear but no HPV testing, up to age 30. After age 30, you should have a Pap smear every 3 years with HPV testing, if no HPV was found  previously.   Most routine colon cancer screening begins at the age of 50. On a yearly basis, doctors may provide special easy to use take-home tests to check for hidden blood in the stool. Sigmoidoscopy or colonoscopy can detect the earliest forms of colon cancer and is life saving. These tests use a small camera at the end of a tube to directly examine the colon. Speak to your caregiver about this at age 50, when routine screening begins (and is repeated every 5 years unless early forms of pre-cancerous polyps or small growths are found).    

## 2017-01-30 NOTE — Progress Notes (Signed)
Subjective:    Patient ID: Jodi Cochran, female    DOB: 07/16/1985, 31 y.o.   MRN: [id number]  HPI Chief Complaint  Patient presents with  . Annual Exam    physical, discharge , possible pap   She is here for a complete physical exam. Last CPE: years ago  No concerns today. States mood is good.   Other providers: none   Social history: Lives with son who is age 569, works as Associate Professorchildcare teacher  Denies smoking or drug use. Drinks one glass of wine nightly.  Diet: fairly healthy Excerise: nothing   Immunizations: Tdap- 9-10 years ago. Flu shot is up to date.   Health maintenance:  Mammogram: never  Colonoscopy: never  Last Pap Smear: years  Last Menstrual cycle: last week  Last Dental Exam: years  Last Eye Exam: this past year   Wears seatbelt always, smoke detectors in home and functioning, does not text while driving and feels safe in home environment.   Reviewed allergies, medications, past medical, surgical, family, and social history.   Review of Systems Review of Systems Constitutional: -fever, -chills, -sweats, -unexpected weight change,-fatigue ENT: -runny nose, -ear pain, -sore throat Cardiology:  -chest pain, -palpitations, -edema Respiratory: -cough, -shortness of breath, -wheezing Gastroenterology: -abdominal pain, -nausea, -vomiting, -diarrhea, -constipation  Hematology: -bleeding or bruising problems Musculoskeletal: -arthralgias, -myalgias, -joint swelling, -back pain Ophthalmology: -vision changes Urology: -dysuria, -difficulty urinating, -hematuria, -urinary frequency, -urgency Neurology: -headache, -weakness, -tingling, -numbness       Objective:   Physical Exam BP 118/78   Pulse 73   Ht 5\' 7"  (1.702 m)   Wt 165 lb 3.2 oz (74.9 kg)   LMP 01/22/2017   SpO2 99%   BMI 25.87 kg/m   General Appearance:    Alert, cooperative, no distress, appears stated age  Head:    Normocephalic, without obvious abnormality, atraumatic  Eyes:    PERRL,  conjunctiva/corneas clear, EOM's intact, fundi    benign  Ears:    Normal TM's and external ear canals  Nose:   Nares normal, mucosa normal, no drainage or sinus   tenderness  Throat:   Lips, mucosa, and tongue normal; teeth and gums normal  Neck:   Supple, no lymphadenopathy;  thyroid:  no   enlargement/tenderness/nodules; no carotid   bruit or JVD  Back:    Spine nontender, no curvature, ROM normal, no CVA     tenderness  Lungs:     Clear to auscultation bilaterally without wheezes, rales or     ronchi; respirations unlabored  Chest Wall:    No tenderness or deformity   Heart:    Regular rate and rhythm, S1 and S2 normal, no murmur, rub   or gallop  Breast Exam:    No tenderness, masses, or nipple discharge or inversion.      No axillary lymphadenopathy  Abdomen:     Soft, non-tender, nondistended, normoactive bowel sounds,    no masses, no hepatosplenomegaly  Genitalia:    Normal external genitalia without lesions.  BUS and vagina normal; cervix without lesions, or cervical motion tenderness. No abnormal vaginal discharge.  Uterus and adnexa not enlarged, nontender, no masses.  Pap performed. Chaperone present.   Rectal:    Not performed due to age<40 and no related complaints  Extremities:   No clubbing, cyanosis or edema  Pulses:   2+ and symmetric all extremities  Skin:   Skin color, texture, turgor normal, no rashes or lesions  Lymph nodes:   Cervical,  supraclavicular, and axillary nodes normal  Neurologic:   CNII-XII intact, normal strength, sensation and gait; reflexes 2+ and symmetric throughout          Psych:   Normal mood, affect, hygiene and grooming.     Urinalysis dipstick: negative       Assessment & Plan:  Routine general medical examination at a health care facility - Plan: POCT Urinalysis DIP (Proadvantage Device), CBC with Differential/Platelet, Lipid panel  Screening for cervical cancer - Plan: Cytology - PAP  Need for Tdap vaccination - Plan: Tdap vaccine  greater than or equal to 7yo IM  Vaccine counseling - Plan: Tdap vaccine greater than or equal to 7yo IM  She appears to be doing well physically and emotionally. Discussed safety and health promotion.  Recommend that she do self breast exams.  She is not currently doing these. Counseled on healthy diet and exercise. Pap smear done.  Chaperone present.  She tolerated this well. Declines STD testing. Tdap given. Counseling done on all components of vaccine. Discussed that she did have mild anemia with her last CBC in October and we will repeat this. Follow-up pending labs.

## 2017-02-05 LAB — CYTOLOGY - PAP
Diagnosis: NEGATIVE
HPV (WINDOPATH): DETECTED — AB

## 2017-02-08 ENCOUNTER — Telehealth: Payer: Self-pay | Admitting: Family Medicine

## 2017-02-08 NOTE — Telephone Encounter (Signed)
Called pt she is coming in Monday afternoon

## 2017-02-08 NOTE — Telephone Encounter (Signed)
Pt requesting a call back ASAP to explain lab results. She is upset that she never received a call and that results went straight to MyChart and she wants explanation about positive HPV

## 2017-02-08 NOTE — Telephone Encounter (Signed)
Spoke with pt- she was made aware of protocol and recommendations for HPV. Advised to use protection if not in a monogamous relationship, etc. She states she sexually active with women, so she is not sure how she developed this. she reports yellowish discharge. Regular periods. Reports uncomfortable odor in vaginal area as well.  Pt would like a call to further explain this to her. She is very concerned. Trixie Rude/RLB

## 2017-02-08 NOTE — Telephone Encounter (Signed)
Have her schedule an office visit so we can discuss this.

## 2017-02-08 NOTE — Telephone Encounter (Signed)
We can de-activate her MyChart account if she wishes. It is protocol that if her Mychart is active, then we send the results through this.  HPV is the human papilloma virus that has been linked to cervical cancer. Guidelines recommend that anyone who is positive for this get a yearly pap smear due to their increased risk of cervical cancer. We can monitor this or she is welcome to see an OB/GYN. Please find out which she would like to do.

## 2017-02-08 NOTE — Telephone Encounter (Signed)
Can you please provide more education that we can inform pt about before calling her back?   Thanks, RLB

## 2017-02-12 ENCOUNTER — Encounter: Payer: Self-pay | Admitting: Family Medicine

## 2017-02-12 ENCOUNTER — Ambulatory Visit (INDEPENDENT_AMBULATORY_CARE_PROVIDER_SITE_OTHER): Payer: No Typology Code available for payment source | Admitting: Family Medicine

## 2017-02-12 VITALS — BP 136/88 | HR 97 | Ht 68.0 in | Wt 166.2 lb

## 2017-02-12 DIAGNOSIS — N898 Other specified noninflammatory disorders of vagina: Secondary | ICD-10-CM

## 2017-02-12 DIAGNOSIS — B977 Papillomavirus as the cause of diseases classified elsewhere: Secondary | ICD-10-CM | POA: Diagnosis not present

## 2017-02-12 DIAGNOSIS — Z113 Encounter for screening for infections with a predominantly sexual mode of transmission: Secondary | ICD-10-CM | POA: Diagnosis not present

## 2017-02-12 DIAGNOSIS — Z708 Other sex counseling: Secondary | ICD-10-CM | POA: Diagnosis not present

## 2017-02-12 NOTE — Progress Notes (Signed)
   Subjective:    Patient ID: Jodi DockerLatasha Shambley, female    DOB: 10/19/1985, 31 y.o.   MRN: [id number]  HPI Chief Complaint  Patient presents with  . Follow-up    go over test results.   She is here to discuss positive HPV results. States she would also like to be tested for all STDs now. Reports having intermittent yellowish vaginal discharge. No itching, odor, discomfort. Denies recent sexual activity. States she used a douche recently but usually does not.  Reports being sexually active with females.   Denies fever, chills, dizziness, chest pain, palpitations, shortness of breath, abdominal pain, N/V/D, urinary symptoms.    She denies receiving Gardasil vaccine.    Review of Systems Pertinent positives and negatives in the history of present illness.     Objective:   Physical Exam BP 136/88 (BP Location: Right Arm, Patient Position: Sitting)   Pulse 97   Ht 5\' 8"  (1.727 m)   Wt 166 lb 3.2 oz (75.4 kg)   LMP 01/22/2017   SpO2 98%   BMI 25.27 kg/m   Alert and oriented and in no acute distress. Declines GU exam.       Assessment & Plan:  Counseling regarding human papilloma virus (HPV)  Screen for STD (sexually transmitted disease) - Plan: C. trachomatis/N. gonorrhoeae RNA, RPR, HIV antibody, Trichomonas vaginalis, RNA  Vaginal discharge - Plan: C. trachomatis/N. gonorrhoeae RNA, RPR, HIV antibody, Trichomonas vaginalis, RNA  HPV in female  Counseled on HPV and risk of cervical cancer related to this. She is aware that she should have annual pap smears and that she may clear this on her own but we will need to monitor. Pap smear was negative otherwise.  Counseled on safe sex.  STD testing done. Follow up pending labs.

## 2017-02-13 LAB — C. TRACHOMATIS/N. GONORRHOEAE RNA
C. trachomatis RNA, TMA: NOT DETECTED
N. GONORRHOEAE RNA, TMA: NOT DETECTED

## 2017-02-13 LAB — RPR: RPR: NONREACTIVE

## 2017-02-13 LAB — HIV ANTIBODY (ROUTINE TESTING W REFLEX): HIV: NONREACTIVE

## 2017-02-13 LAB — TRICHOMONAS VAGINALIS, PROBE AMP: Trichomonas vaginalis RNA: NOT DETECTED

## 2017-03-26 ENCOUNTER — Ambulatory Visit (INDEPENDENT_AMBULATORY_CARE_PROVIDER_SITE_OTHER): Payer: No Typology Code available for payment source | Admitting: Family Medicine

## 2017-03-26 ENCOUNTER — Encounter: Payer: Self-pay | Admitting: Family Medicine

## 2017-03-26 VITALS — BP 138/88 | HR 80 | Ht 68.0 in | Wt 162.6 lb

## 2017-03-26 DIAGNOSIS — F419 Anxiety disorder, unspecified: Secondary | ICD-10-CM

## 2017-03-26 MED ORDER — ALPRAZOLAM 0.5 MG PO TABS: 0.2500 mg | ORAL_TABLET | Freq: Three times a day (TID) | ORAL | 0 refills | Status: DC | PRN

## 2017-03-26 NOTE — Progress Notes (Signed)
Chief Complaint  Patient presents with  . Anxiety    [facility], feels like anxiety is worsening. It was recommended that she go to counseling but did not due to insurance reasons.     She feels like her anxiety is getting worse.  Feels like her thoughts "overtake her" sometimes. Denies dangerous thoughts (no SI/HI).  Feels like her thoughts are racing, has trouble focusing.  She had anxiety related to work that she spoke to [facility] about in the past. Jodi KempfFinds it getting worse now overall. She has been drinking and smoking to help try and calm her down, worse in the last month.  She has work stress (not as bad as in the past).  She really isn't sure of any particular trigger right now.  Get more irritable, "flips out easily". She stays in bed more.  Hasn't been want to go to work the last few weeks, has been late for work. Likes to sleep, as her mind is quiet then.  She originally started with anxiety when her aunt had gotten sick (lived with her), when she was in middle and high school.  She had panic attacks  She can't recall the medication she was treated with.  She learned to cope with it.  No issues with pregnancy.  Only had recurrent anxiety more recently.  She thinks she may have ADHD, never treated or diagnosed.  Her daughter has it.  She feels like she acted similarly--trouble focusing.  Denies insomnia, pressured speech, poor decisions such as overspending or other symptoms of manica.  No EAP available through work.  Denies possible pregnancy.  PMH, PSH, SH reviewed  Current Outpatient Medications on File Prior to Visit  Medication Sig Dispense Refill  . Multiple Vitamins-Minerals (MULTIVITAMIN GUMMIES ADULT PO) Take by mouth.    . Ranitidine HCl (ZANTAC PO) Take 1 tablet by mouth daily as needed (heartburn).    . Ibuprofen (ADVIL PO) Take by mouth.    . [DISCONTINUED] ipratropium (ATROVENT) 0.06 % nasal spray Place 2 sprays into both nostrils 4 (four) times  daily. (Patient not taking: Reported on 06/11/2014) 15 mL 1   No current facility-administered medications on file prior to visit.    Allergies  Allergen Reactions  . Flagyl [Metronidazole] Nausea Only    Anxiety as well  . Promethazine     (Phenergan)- Nervous and Fatigue    ROS:  No fever, chills, URI symptoms, chest pain, palpitations, headaches, depression, SI/HI.  +anxiety and attention/focus problems. See HPI  PHYSICAL EXAM:  BP 138/88   Pulse 80   Ht 5\' 8"  (1.727 m)   Wt 162 lb 9.6 oz (73.8 kg)   BMI 24.72 kg/m   Well developed, mildly anxious female, in no distress She does not appear distractable, , follows line of questioning.  Normal eye contact, speech, no hyperactivity. Normal hygiene and grooming.  ASSESSMENT/PLAN:  Anxiety - some trouble with thoughts and focus; rec eval for ADHD. Use xanax sparingly; risks/side effects reviewed. Counseling. Consider preventative meds - Plan: ALPRAZolam (XANAX) 0.5 MG tablet  30 min visit, more than 1/2 spent counseling. F/u with Jodi Cochran after testing/psych eval.   It sounds as though there may be both anxiety as well as attention deficit issues at hand.  I think the best place to start is further evaluation with testing, that way we can make sure we are treating the appropriate problems.  [facility], or [facility] likely both offer testing through their  psychologists.  They also provide counseling.  [facility] offers the ADHD screening tests (is not a psychologist office, won't provide longterm treatment/counseling for anxiety).  In the interim, please work on stress reduction and relaxation techniques as we discussed (regular exercise, breathing techniques, etc). Please stop smoking, and cut back on alcohol--not to use to relax.   If you end up having more issues with anxiety attacks, or you are given a diagnosis that requires treatment, follow up here with your  results.  You may try taking the alprazolam if needed when feeling extremely anxious. Start with 1/2 tablet, and increase to the full only if 1/2 tablet isn't effective.  Do not mix this with alcohol. It may be sedating (cause drowsiness), so be sure not to drive if you feel drowsy.

## 2017-03-26 NOTE — Patient Instructions (Addendum)
  It sounds as though there may be both anxiety as well as attention deficit issues at hand.  I think the best place to start is further evaluation with testing, that way we can make sure we are treating the appropriate problems.  [facility], or [facility] likely both offer testing through their psychologists.  They also provide counseling.  [facility] offers the ADHD screening tests (is not a psychologist office, won't provide longterm treatment/counseling for anxiety).  In the interim, please work on stress reduction and relaxation techniques as we discussed (regular exercise, breathing techniques, etc). Please stop smoking, and cut back on alcohol--not to use to relax.  You may try taking the alprazolam if needed when feeling extremely anxious. Start with 1/2 tablet, and increase to the full only if 1/2 tablet isn't effective.  Do not mix this with alcohol. It may be sedating (cause drowsiness), so be sure not to drive if you feel drowsy.

## 2017-03-30 ENCOUNTER — Telehealth: Payer: Self-pay | Admitting: Family Medicine

## 2017-03-30 NOTE — Telephone Encounter (Signed)
Pt called and stated that the xanax she was given is not helping. It was her understanding that she would be given a higher dose than 0.5. She was prescribed 10 and she has taken all of those. She is requesting a refill at a higher dose. Pt can be reached at [phone] and uses [facility] on [address]

## 2017-03-30 NOTE — Telephone Encounter (Signed)
As far as I know, she has never taken this before, and 0.5mg  is a dose that many people take at nighttime due to the sedation it causes.  We never discussed any higher dose, we said to start with 1/2 tablet if taking during the day.  We said to use this sparingly if needed for severe anxiety that she couldn't calm down with other techniques.  We discussed ADHD potentially contributing to her mind feeling busy, and testing was recommended.  I will not increase the dose or send a refill at this time.  She can return next week to see any provider to discuss potentially starting daily/preventative medications.  Her symptoms as we discussed did not appear to warrant very frequent use of alprazolam, and we discussed that she would be only getting 10 for rare/emergency use.  Clearly she is having ongoing issues, and needs to be addressed at another visit.  If she gets upset, she can get another #10 of the same dose as a refill to last through until she can get seen.

## 2017-03-30 NOTE — Telephone Encounter (Signed)
Called pt and explain this to her, she made an appt to come on Tuesday to discuss change medicine dose.

## 2017-04-03 ENCOUNTER — Encounter: Payer: Self-pay | Admitting: Family Medicine

## 2017-04-03 ENCOUNTER — Ambulatory Visit (INDEPENDENT_AMBULATORY_CARE_PROVIDER_SITE_OTHER): Payer: No Typology Code available for payment source | Admitting: Family Medicine

## 2017-04-03 VITALS — BP 140/90 | HR 92 | Ht 68.0 in | Wt 161.2 lb

## 2017-04-03 DIAGNOSIS — F909 Attention-deficit hyperactivity disorder, unspecified type: Secondary | ICD-10-CM | POA: Diagnosis not present

## 2017-04-03 DIAGNOSIS — F419 Anxiety disorder, unspecified: Secondary | ICD-10-CM | POA: Diagnosis not present

## 2017-04-03 DIAGNOSIS — F321 Major depressive disorder, single episode, moderate: Secondary | ICD-10-CM

## 2017-04-03 MED ORDER — CITALOPRAM HYDROBROMIDE 20 MG PO TABS: ORAL_TABLET | ORAL | 1 refills | Status: DC

## 2017-04-03 MED ORDER — ALPRAZOLAM 0.5 MG PO TABS: 0.5000 mg | ORAL_TABLET | Freq: Three times a day (TID) | ORAL | 0 refills | Status: DC | PRN

## 2017-04-03 NOTE — Progress Notes (Signed)
Chief Complaint  Patient presents with  . Advice Only    xanax did not really help, saw counselor-notes were faxed over and I put them in door for your review.     "I feel like I'm losing my shit" at work. Work triggers her anxiety.  Slept all weekend, feels drained.  She needed to take the xanax at work, didn't make her sleepy, didn't seem to help.  Even taking the full tablet.  She was tired by the end of the day, not shortly after taking it.  She started taking it every morning, because it is very hectic in her house and at work in the mornings.     Copy of comprehensive clinical assessment from [facility] was faxed over from  03/28/17 PHQ-9 score of 23 GAD-7 score of 20  Diagnosed with MDD, single episode, moderate, also with ADHD and anxiety. See scanned sheet  PMH, PSH, SH reviewed  Current Outpatient Medications on File Prior to Visit  Medication Sig Dispense Refill  . Multiple Vitamins-Minerals (MULTIVITAMIN GUMMIES ADULT PO) Take by mouth.    . Ranitidine HCl (ZANTAC PO) Take 1 tablet by mouth daily as needed (heartburn).    . Ibuprofen (ADVIL PO) Take by mouth.    . [DISCONTINUED] ipratropium (ATROVENT) 0.06 % nasal spray Place 2 sprays into both nostrils 4 (four) times daily. (Patient not taking: Reported on 06/11/2014) 15 mL 1   No current facility-administered medications on file prior to visit.    (finished the 0.5mg  #10 of alprazolam she was given at last visit)  Allergies  Allergen Reactions  . Flagyl [Metronidazole] Nausea Only    Anxiety as well  . Promethazine     (Phenergan)- Nervous and Fatigue     ROS:  Depression, anxiety, fatigue, irritability per HPI. No fever, URI symptoms, chest pain, or other complaints  PHYSICAL EXAM:  BP 140/90   Pulse 92   Ht 5\' 8"  (1.727 m)   Wt 161 lb 3.2 oz (73.1 kg)   LMP 03/16/2017 (Approximate)   BMI 24.51 kg/m   Well appearing, pleasant female, mildly anxious, overall appears calm, only intermittently  slightly excitable.  No hyperactivity noted. Normal hygiene, grooming, eye contact and speech. Alert and oriented  ASSESSMENT/PLAN:   Current moderate episode of major depressive disorder without prior episode (HCC) - start citalopram, counseled re: risks/side effects.  Discussed seeing intern at same psych group (for free) vs checking with her insurance for therapists - Plan: citalopram (CELEXA) 20 MG tablet  Anxiety - counseled re: risks/SE of alprazolam, and citalopram, to help with both anxiety and depression. Encouraged counseling. 1 week off work - Plan: citalopram (CELEXA) 20 MG tablet, ALPRAZolam (XANAX) 0.5 MG tablet  Attention deficit hyperactivity disorder (ADHD), unspecified ADHD type - hold off on starting medications so that side effects do not get confused. Consider starting at f/u next month.   Out of work x 1 week, RTW 1/30, to allow for starting meds, side effects, and potentially start counseling. F/u with [name] in 4-6 weeks. At some point will likely need ADHD meds started as well.   Start taking citalopram 1/2 tablet once daily.  Take it in the morning (change to bedtime only if needed for side effects).  Expect some headache, nausea, maybe stomach upset, and potential slight increase in anxiety early on. These symptoms should improve over the first few days.  If better, go up to the full tablet at 1 week. If you still have side effects, remain  on the 1/2 tablet for a little longer, eventually trying to increase to the full pill.   You may use the alprazolam AS NEEDED for significant anxiety.  If 1/2 tablet didn't do anything or make you tired, you can use the full pill, but be careful with driving, and don't mix with alcohol.  Your need for the alprazolam should decrease as the citalopram gets into your system.  The citalopram treat both depression and anxiety.  You were also diagnosed with ADHD--I don't want to start too many medications at once. Return in 4-6 weeks and we  will see how your moods are, and consider starting ADHD medications at that time.  I think it can benefit you to be out of work for the first week while getting adjusted to the new medications, especially if you can get some counseling in during this time (to help you develop some relaxation and coping techniques for the anxiety).

## 2017-04-03 NOTE — Patient Instructions (Addendum)
  Start taking citalopram 1/2 tablet once daily.  Take it in the morning (change to bedtime only if needed for side effects).  Expect some headache, nausea, maybe stomach upset, and potential slight increase in anxiety early on. These symptoms should improve over the first few days.  If better, go up to the full tablet at 1 week. If you still have side effects, remain on the 1/2 tablet for a little longer, eventually trying to increase to the full pill.   You may use the alprazolam AS NEEDED for significant anxiety.  If 1/2 tablet didn't do anything or make you tired, you can use the full pill, but be careful with driving, and don't mix with alcohol.  Your need for the alprazolam should decrease as the citalopram gets into your system.  The citalopram treat both depression and anxiety.  You were also diagnosed with ADHD--I don't want to start too many medications at once. Return in 4-6 weeks and we will see how your moods are, and consider starting ADHD medications at that time.   I think it can benefit you to be out of work for the first week while getting adjusted to the new medications, especially if you can get some counseling in during this time (to help you develop some relaxation and coping techniques for the anxiety).

## 2017-04-06 ENCOUNTER — Telehealth: Payer: Self-pay | Admitting: Family Medicine

## 2017-04-06 ENCOUNTER — Encounter (HOSPITAL_COMMUNITY): Payer: Self-pay | Admitting: Psychiatry

## 2017-04-06 ENCOUNTER — Ambulatory Visit (INDEPENDENT_AMBULATORY_CARE_PROVIDER_SITE_OTHER): Payer: PRIVATE HEALTH INSURANCE | Admitting: Psychiatry

## 2017-04-06 DIAGNOSIS — F321 Major depressive disorder, single episode, moderate: Secondary | ICD-10-CM | POA: Diagnosis not present

## 2017-04-06 NOTE — Telephone Encounter (Signed)
Pt called to ask if she should get FMLA because of her job and concerns she has with them letting her off for appointments and issues she is having,  I advised she should speak to her HR department about this to see if this is necessary.

## 2017-04-06 NOTE — Telephone Encounter (Signed)
Pt came in and dropped off FMLA forms. Sending back to be completed. Please fax to work fax number on attached business card. Pt can be reached at [phone].
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
## 2017-04-10 ENCOUNTER — Telehealth: Payer: Self-pay | Admitting: Family Medicine

## 2017-04-10 NOTE — Telephone Encounter (Signed)
Pt called and wanted to speak to Dr. Lynelle DoctorKnapp, I advised she is not here today and I asked if I could help, she states she is having suicidal ideations today, not doing good, she states she increased the Celexa today as instructed but just not doing good.  I spoke to Dr. Susann GivensLalonde regarding this patient and he instructed me to advise her to go to [facility].  I advised pt of this and she was afraid of being admitted, I advised they were trained in this type of crisis and they would be able to help her and assess her and see what she needs and help her.  She agreed to go.

## 2017-04-10 NOTE — Telephone Encounter (Signed)
Noted.  It doesn't appear that she has arrived at ER yet  She also saw a counselor last week, that she could likely call and speak with

## 2017-04-10 NOTE — Telephone Encounter (Signed)
Forms were completed yesterday, and are in outgoing red folder on shelf

## 2017-04-11 ENCOUNTER — Telehealth: Payer: Self-pay

## 2017-04-11 NOTE — Telephone Encounter (Signed)
Called pt to ]inform her that her FMLA paper work was filled out and faxed over to [employer]. Pt was also advised that her paper work is ready for pick up . Thanks [employer]

## 2017-04-12 ENCOUNTER — Encounter (HOSPITAL_COMMUNITY): Payer: Self-pay | Admitting: Psychiatry

## 2017-04-12 ENCOUNTER — Ambulatory Visit (INDEPENDENT_AMBULATORY_CARE_PROVIDER_SITE_OTHER): Payer: PRIVATE HEALTH INSURANCE | Admitting: Psychiatry

## 2017-04-12 DIAGNOSIS — F321 Major depressive disorder, single episode, moderate: Secondary | ICD-10-CM | POA: Diagnosis not present
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
## 2017-05-03 ENCOUNTER — Ambulatory Visit (HOSPITAL_COMMUNITY): Payer: PRIVATE HEALTH INSURANCE | Admitting: Psychiatry

## 2017-05-07 ENCOUNTER — Encounter: Payer: Self-pay | Admitting: Family Medicine

## 2017-05-07 ENCOUNTER — Ambulatory Visit (INDEPENDENT_AMBULATORY_CARE_PROVIDER_SITE_OTHER): Payer: No Typology Code available for payment source | Admitting: Family Medicine

## 2017-05-07 VITALS — BP 120/80 | HR 60 | Wt 160.0 lb

## 2017-05-07 DIAGNOSIS — F419 Anxiety disorder, unspecified: Secondary | ICD-10-CM | POA: Diagnosis not present

## 2017-05-07 DIAGNOSIS — F329 Major depressive disorder, single episode, unspecified: Secondary | ICD-10-CM | POA: Diagnosis not present

## 2017-05-07 DIAGNOSIS — F32A Depression, unspecified: Secondary | ICD-10-CM

## 2017-05-07 NOTE — Progress Notes (Deleted)
Psychiatric Initial Adult Assessment   Patient Identification: Jodi Cochran MRN:  [id number] Date of Evaluation:  05/07/2017 Referral Source: Adah SalvageBynum, Jodi E, LCSW Chief Complaint:   Visit Diagnosis: No diagnosis found.  History of Present Illness:   Jodi Cochran is a 32 y.o. year old female with a history of depression, who is referred for depression.     Associated Signs/Symptoms: Depression Symptoms:  {DEPRESSION SYMPTOMS:20000} (Hypo) Manic Symptoms:  {BHH MANIC SYMPTOMS:22872} Anxiety Symptoms:  {BHH ANXIETY SYMPTOMS:22873} Psychotic Symptoms:  {BHH PSYCHOTIC SYMPTOMS:22874} PTSD Symptoms: {BHH PTSD SYMPTOMS:22875}  Past Psychiatric History:  Outpatient:  Psychiatry admission:  Previous suicide attempt:  Past trials of medication:  History of violence:   Previous Psychotropic Medications: {YES/NO:21197}  Substance Abuse History in the last 12 months:  {yes no:314532}  Consequences of Substance Abuse: {BHH CONSEQUENCES OF SUBSTANCE ABUSE:22880}  Past Medical History:  Past Medical History:  Diagnosis Date  . GERD (gastroesophageal reflux disease)   . HPV in female   . IBS (irritable bowel syndrome)     Past Surgical History:  Procedure Laterality Date  . NO PAST SURGERIES      Family Psychiatric History: ***  Family History:  Family History  Problem Relation Age of Onset  . Hypertension Mother   . Depression Mother   . Hypertension Maternal Grandmother   . Diabetes Maternal Grandmother   . Glaucoma Maternal Grandmother   . Thyroid disease Maternal Grandmother   . Depression Maternal Grandmother   . Glaucoma Paternal Grandmother   . Osteoarthritis Paternal Grandmother   . Bone cancer Paternal Grandmother     Social History:   Social History   Socioeconomic History  . Marital status: Single    Spouse name: Not on file  . Number of children: Not on file  . Years of education: Not on file  . Highest education level: Not on file  Social Needs   . Financial resource strain: Not on file  . Food insecurity - worry: Not on file  . Food insecurity - inability: Not on file  . Transportation needs - medical: Not on file  . Transportation needs - non-medical: Not on file  Occupational History  . Not on file  Tobacco Use  . Smoking status: Current Every Day Smoker    Packs/day: 0.25    Types: Cigarettes  . Smokeless tobacco: Never Used  . Tobacco comment: provided information regarding [place] Quit line  Substance and Sexual Activity  . Alcohol use: Yes    Comment: last used this past week, was drinking  4 glasses of wine per night, heavy drinking on weekends .  Marland Kitchen. Drug use: Yes    Types: Marijuana    Comment: about once a month  . Sexual activity: Yes    Partners: Female    Birth control/protection: None  Other Topics Concern  . Not on file  Social History Narrative   Lives with her young daughter.   Works as Glass blower/designerdaycare teacher    Additional Social History: ***  Allergies:   Allergies  Allergen Reactions  . Flagyl [Metronidazole] Nausea Only    Anxiety as well  . Promethazine     (Phenergan)- Nervous and Fatigue     Metabolic Disorder Labs: No results found for: HGBA1C, MPG No results found for: PROLACTIN Lab Results  Component Value Date   CHOL 192 01/30/2017   TRIG 68 01/30/2017   HDL 64 01/30/2017   CHOLHDL 3.0 01/30/2017     Current Medications: Current Outpatient Medications  Medication  Sig Dispense Refill  . ALPRAZolam (XANAX) 0.5 MG tablet Take 1 tablet (0.5 mg total) by mouth 3 (three) times daily as needed for anxiety. 30 tablet 0  . citalopram (CELEXA) 20 MG tablet Start at 1/2 tablet by mouth every morning.  Increase to 1 full pill after a week, if tolerated 30 tablet 1  . Ibuprofen (ADVIL PO) Take by mouth.    . Multiple Vitamins-Minerals (MULTIVITAMIN GUMMIES ADULT PO) Take by mouth.    . Ranitidine HCl (ZANTAC PO) Take 1 tablet by mouth daily as needed (heartburn).     No current  facility-administered medications for this visit.     Neurologic: Headache: No Seizure: No Paresthesias:No  Musculoskeletal: Strength & Muscle Tone: within normal limits Gait & Station: normal Patient leans: N/A  Psychiatric Specialty Exam: ROS  There were no vitals taken for this visit.There is no height or weight on file to calculate BMI.  General Appearance: Fairly Groomed  Eye Contact:  Good  Speech:  Clear and Coherent  Volume:  Normal  Mood:  {BHH MOOD:22306}  Affect:  {Affect (PAA):22687}  Thought Process:  Coherent and Goal Directed  Orientation:  Full (Time, Place, and Person)  Thought Content:  Logical  Suicidal Thoughts:  {ST/HT (PAA):22692}  Homicidal Thoughts:  {ST/HT (PAA):22692}  Memory:  Immediate;   Good Recent;   Good Remote;   Good  Judgement:  {Judgement (PAA):22694}  Insight:  {Insight (PAA):22695}  Psychomotor Activity:  Normal  Concentration:  Concentration: Good and Attention Span: Good  Recall:  Good  Fund of Knowledge:Good  Language: Good  Akathisia:  No  Handed:  Right  AIMS (if indicated):  N/A  Assets:  Communication Skills Desire for Improvement  ADL's:  Intact  Cognition: WNL  Sleep:  ***   Assessment  Plan  The patient demonstrates the following risk factors for suicide: Chronic risk factors for suicide include: {Chronic Risk Factors for VHQIONG:29528413}. Acute risk factors for suicide include: {Acute Risk Factors for KGMWNUU:72536644}. Protective factors for this patient include: {Protective Factors for Suicide IHKV:42595638}. Considering these factors, the overall suicide risk at this point appears to be {Desc; low/moderate/high:110033}. Patient {ACTION; IS/IS VFI:43329518} appropriate for outpatient follow up.    Treatment Plan Summary: Plan as above   Neysa Hotter, MD 2/25/20199:27 AM

## 2017-05-07 NOTE — Progress Notes (Signed)
   Subjective:    Patient ID: Jodi Cochran, female    DOB: 06/04/1985, 32 y.o.   MRN: [id number]  HPI Chief Complaint  Patient presents with  . follow-up on celexa    follow-up on celexa and start on adhd med   She is here to follow up on anxiety and depression. States she is doing well on Celexa. She reports being able to focus better and no longer having "racing thoughts".  She was assessed and diagnosed with ADHD but I do not have this documentation today. My co-worker Dr. Lynelle Cochran did see this paperwork and documented this in her previous visit. She does not want to start on medication for ADHD unless absolutely needed since she is doing so well.   States a few days after starting Celexa she had a bad day and thought about suicide but did not have a plan and states "I could never actually do anything to hurt myself". She denies SI or HI.   States she has been going to [facility] to see a therapist and this has been helping.   No new concerns today.   Denies fever, chills, dizziness, chest pain, palpitations, shortness of breath, abdominal pain, N/V/D.   Reviewed allergies, medications, past medical, surgical, family, and social history.    Review of Systems Pertinent positives and negatives in the history of present illness.     Objective:   Physical Exam BP 120/80   Pulse 60   Wt 160 lb (72.6 kg)   BMI 24.33 kg/m   Alert and oriented and in no acute distress. Not otherwise examined.       Assessment & Plan:  Anxiety and depression  She appears to be doing well with counseling and Celexa. We will hold off for now on ADHD medication. I will most likely consider Strattera or another long acting medication if she decides to start on this. It is not clear as to where the documentation is at this point regarding her assessment for ADHD and anxiety but Dr. Lynelle Cochran did send this to be scanned into her EMR.  Verbal contract obtained that if she has suicidal thoughts that she will  call 911 [facility]  Follow up in 8-12 weeks or sooner if needed.

## 2017-05-08 ENCOUNTER — Ambulatory Visit (HOSPITAL_COMMUNITY): Payer: PRIVATE HEALTH INSURANCE | Admitting: Psychiatry

## 2017-05-17 ENCOUNTER — Ambulatory Visit (HOSPITAL_COMMUNITY): Payer: Self-pay | Admitting: Psychiatry

## 2017-05-18 ENCOUNTER — Ambulatory Visit: Payer: PRIVATE HEALTH INSURANCE | Admitting: Psychology

## 2017-05-31 ENCOUNTER — Ambulatory Visit (HOSPITAL_COMMUNITY): Payer: PRIVATE HEALTH INSURANCE | Admitting: Psychiatry

## 2017-06-04 ENCOUNTER — Telehealth: Payer: Self-pay | Admitting: Family Medicine

## 2017-06-04 DIAGNOSIS — F321 Major depressive disorder, single episode, moderate: Secondary | ICD-10-CM

## 2017-06-04 DIAGNOSIS — F419 Anxiety disorder, unspecified: Secondary | ICD-10-CM

## 2017-06-04 MED ORDER — CITALOPRAM HYDROBROMIDE 20 MG PO TABS: ORAL_TABLET | ORAL | 1 refills | Status: DC

## 2017-06-04 NOTE — Telephone Encounter (Signed)
Ok to refill 

## 2017-06-04 NOTE — Telephone Encounter (Signed)
done

## 2017-06-04 NOTE — Telephone Encounter (Signed)
Pt called and is requesting a refill on her celexa pt uses [facility] [phone]#[id number] [place] - [address] pt can be reached at [phone] and pt is out she took her last pill today

## 2017-06-14 ENCOUNTER — Ambulatory Visit (HOSPITAL_COMMUNITY): Payer: Self-pay | Admitting: Psychiatry

## 2017-07-17 ENCOUNTER — Other Ambulatory Visit: Payer: Self-pay | Admitting: Family Medicine

## 2017-07-17 ENCOUNTER — Telehealth: Payer: Self-pay | Admitting: Family Medicine
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
## 2017-07-18 ENCOUNTER — Ambulatory Visit (INDEPENDENT_AMBULATORY_CARE_PROVIDER_SITE_OTHER): Payer: No Typology Code available for payment source | Admitting: Family Medicine

## 2017-07-18 ENCOUNTER — Encounter: Payer: Self-pay | Admitting: Family Medicine

## 2017-07-18 ENCOUNTER — Ambulatory Visit: Payer: No Typology Code available for payment source | Admitting: Family Medicine

## 2017-07-18 VITALS — BP 130/90 | HR 68 | Temp 98.4°F | Wt 160.4 lb

## 2017-07-18 DIAGNOSIS — R21 Rash and other nonspecific skin eruption: Secondary | ICD-10-CM | POA: Diagnosis not present

## 2017-07-18 DIAGNOSIS — Z9189 Other specified personal risk factors, not elsewhere classified: Secondary | ICD-10-CM | POA: Diagnosis not present

## 2017-07-18 DIAGNOSIS — Z0283 Encounter for blood-alcohol and blood-drug test: Secondary | ICD-10-CM

## 2017-07-18 NOTE — Telephone Encounter (Signed)
Left message for pt to call me back 
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
## 2017-07-20 LAB — DRUG SCREEN 10 W/CONF, SERUM
Amphetamines, IA: NEGATIVE ng/mL
Barbiturates, IA: NEGATIVE ug/mL
Benzodiazepines, IA: NEGATIVE ng/mL
Cocaine & Metabolite, IA: NEGATIVE ng/mL
METHADONE, IA: NEGATIVE ng/mL
OPIATES, IA: NEGATIVE ng/mL
Oxycodones, IA: NEGATIVE ng/mL
PHENCYCLIDINE, IA: NEGATIVE ng/mL
Propoxyphene, IA: NEGATIVE ng/mL
THC(MARIJUANA) METABOLITE, IA: NEGATIVE ng/mL

## 2017-07-20 LAB — RPR: RPR: NONREACTIVE

## 2017-07-20 LAB — GC/CHLAMYDIA PROBE AMP
CHLAMYDIA, DNA PROBE: NEGATIVE
NEISSERIA GONORRHOEAE BY PCR: NEGATIVE

## 2017-07-20 LAB — HIV ANTIBODY (ROUTINE TESTING W REFLEX): HIV Screen 4th Generation wRfx: NONREACTIVE

## 2017-07-22 ENCOUNTER — Encounter (HOSPITAL_BASED_OUTPATIENT_CLINIC_OR_DEPARTMENT_OTHER): Payer: Self-pay | Admitting: Emergency Medicine

## 2017-07-22 ENCOUNTER — Other Ambulatory Visit: Payer: Self-pay

## 2017-07-22 ENCOUNTER — Emergency Department (HOSPITAL_BASED_OUTPATIENT_CLINIC_OR_DEPARTMENT_OTHER)
Admission: EM | Admit: 2017-07-22 | Discharge: 2017-07-22 | Disposition: A | Discharge status: Home / Self Care | Payer: PRIVATE HEALTH INSURANCE | Attending: Emergency Medicine | Admitting: Emergency Medicine

## 2017-07-22 DIAGNOSIS — R112 Nausea with vomiting, unspecified: Secondary | ICD-10-CM | POA: Diagnosis not present

## 2017-07-22 DIAGNOSIS — F17210 Nicotine dependence, cigarettes, uncomplicated: Secondary | ICD-10-CM | POA: Insufficient documentation

## 2017-07-22 DIAGNOSIS — Z79899 Other long term (current) drug therapy: Secondary | ICD-10-CM | POA: Diagnosis not present

## 2017-07-22 DIAGNOSIS — R197 Diarrhea, unspecified: Secondary | ICD-10-CM | POA: Diagnosis not present

## 2017-07-22 LAB — COMPREHENSIVE METABOLIC PANEL
ALT: 16 U/L (ref 14–54)
ANION GAP: 8 (ref 5–15)
AST: 25 U/L (ref 15–41)
Albumin: 4.6 g/dL (ref 3.5–5.0)
Alkaline Phosphatase: 72 U/L (ref 38–126)
BILIRUBIN TOTAL: 0.6 mg/dL (ref 0.3–1.2)
BUN: 12 mg/dL (ref 6–20)
CO2: 22 mmol/L (ref 22–32)
Calcium: 9 mg/dL (ref 8.9–10.3)
Chloride: 107 mmol/L (ref 101–111)
Creatinine, Ser: 0.61 mg/dL (ref 0.44–1.00)
GFR calc Af Amer: >60 mL/min (ref >60)
GFR calc non Af Amer: >60 mL/min (ref >60)
GLUCOSE: 88 mg/dL (ref 65–99)
POTASSIUM: 3.9 mmol/L (ref 3.5–5.1)
Sodium: 137 mmol/L (ref 135–145)
TOTAL PROTEIN: 8 g/dL (ref 6.5–8.1)

## 2017-07-22 LAB — CBC WITH DIFFERENTIAL/PLATELET
Basophils Absolute: 0 10*3/uL (ref 0.0–0.1)
Basophils Relative: 0 %
Eosinophils Absolute: 0.3 10*3/uL (ref 0.0–0.7)
Eosinophils Relative: 4 %
HEMATOCRIT: 37.9 % (ref 36.0–46.0)
Hemoglobin: 13.1 g/dL (ref 12.0–15.0)
LYMPHS PCT: 13 %
Lymphs Abs: 1.2 10*3/uL (ref 0.7–4.0)
MCH: 29.3 pg (ref 26.0–34.0)
MCHC: 34.6 g/dL (ref 30.0–36.0)
MCV: 84.8 fL (ref 78.0–100.0)
MONOS PCT: 4 %
Monocytes Absolute: 0.3 10*3/uL (ref 0.1–1.0)
NEUTROS ABS: 7.1 10*3/uL (ref 1.7–7.7)
Neutrophils Relative %: 79 %
Platelets: 334 10*3/uL (ref 150–400)
RBC: 4.47 MIL/uL (ref 3.87–5.11)
RDW: 13.3 % (ref 11.5–15.5)
WBC: 9 10*3/uL (ref 4.0–10.5)

## 2017-07-22 LAB — URINALYSIS, MICROSCOPIC (REFLEX): RBC / HPF: NONE SEEN RBC/hpf (ref 0–5)

## 2017-07-22 LAB — URINALYSIS, ROUTINE W REFLEX MICROSCOPIC
Bilirubin Urine: NEGATIVE
GLUCOSE, UA: NEGATIVE mg/dL
HGB URINE DIPSTICK: NEGATIVE
Ketones, ur: NEGATIVE mg/dL
LEUKOCYTES UA: NEGATIVE
Nitrite: NEGATIVE
PH: 8.5 — AB (ref 5.0–8.0)
Protein, ur: 30 mg/dL — AB
Specific Gravity, Urine: 1.015 (ref 1.005–1.030)

## 2017-07-22 LAB — PREGNANCY, URINE: Preg Test, Ur: NEGATIVE

## 2017-07-22 MED ORDER — ONDANSETRON HCL 4 MG/2ML IJ SOLN
4.0000 mg | Freq: Once | INTRAMUSCULAR | Status: AC
  Administered 2017-07-22: 4 mg via INTRAVENOUS
  Filled 2017-07-22: qty 2

## 2017-07-22 MED ORDER — SODIUM CHLORIDE 0.9 % IV BOLUS
1000.0000 mL | Freq: Once | INTRAVENOUS | Status: AC
  Administered 2017-07-22: 1000 mL via INTRAVENOUS

## 2017-07-22 MED ORDER — ONDANSETRON 4 MG PO TBDP: 4.0000 mg | ORAL_TABLET | Freq: Three times a day (TID) | ORAL | 0 refills | Status: DC | PRN

## 2017-07-22 NOTE — ED Notes (Signed)
While triaging the patinet she states that she is going to "pass out" - patient asked to sit back and in the chair and not forward, patient uncooperative with this. After a few minutes the patient throws up

## 2017-07-22 NOTE — Discharge Instructions (Signed)
Follow up with your primary care doctor to discuss your hospital visit. Continue to hydrate orally with small sips of fluids throughout the day. Use Zofran as directed for nausea & vomiting.  ° °The 'BRAT' diet is suggested, then progress to diet as tolerated as symptoms abate.  °Bananas.  °Rice.  °Applesauce.  °Toast (and other simple starches such as crackers, potatoes, noodles).  ° °SEEK IMMEDIATE MEDICAL ATTENTION IF: °You begin having localized abdominal pain that does not go away or becomes severe °A temperature above 101 develops °Repeated vomiting occurs (multiple uncontrollable episodes) or you are unable to keep fluids down °Blood is being passed in stools or vomit (bright red or black tarry stools).  °New or worsening symptoms develop, any additional concerns.  °

## 2017-07-22 NOTE — ED Provider Notes (Signed)
[facility] EMERGENCY DEPARTMENT Provider Note   CSN: [id number] Arrival date & time: 07/22/17  1328     History   Chief Complaint Chief Complaint  Patient presents with  . Emesis    HPI Jodi Cochran is a 32 y.o. female.  The history is provided by the patient and medical records. No language interpreter was used.  Emesis   Associated symptoms include abdominal pain and diarrhea. Pertinent negatives include no chills and no fever.   Jodi Cochran is a 32 y.o. female  with a PMH of IBS, GERD who presents to the Emergency Department complaining of n/v/d which began yesterday. Associated with generalized abdominal pain. She reports multiple episodes of emesis today, most recently just prior to coming to ER. No medications taken prior to arrival for symptoms. Denies urinary symptoms, back pain, vaginal discharge, blood in the stool, fever, chills. She reports history of similar due to her IBS.   She additionally reports bilateral ear pain for the last week, left > right as well as nasal congestion. No medications taken prior to arrival for this.   Past Medical History:  Diagnosis Date  . GERD (gastroesophageal reflux disease)   . HPV in female   . IBS (irritable bowel syndrome)     Patient Active Problem List   Diagnosis Date Noted  . HPV in female   . ESOPHAGEAL REFLUX 04/26/2009  . HEMORRHAGE OF RECTUM AND ANUS 04/26/2009  . ABDOMINAL PAIN, EPIGASTRIC 04/26/2009    Past Surgical History:  Procedure Laterality Date  . NO PAST SURGERIES       OB History    Gravida  2   Para  1   Term      Preterm  1   AB      Living  1     SAB      TAB      Ectopic      Multiple      Live Births  1            Home Medications    Prior to Admission medications   Medication Sig Start Date End Date Taking? Authorizing Provider  citalopram (CELEXA) 20 MG tablet 1 tablet by mouth daily 06/04/17   Henson, [name] L, NP-C  Ibuprofen (ADVIL PO) Take by  mouth.    [provider]  Multiple Vitamins-Minerals (MULTIVITAMIN GUMMIES ADULT PO) Take by mouth.    [provider]  ondansetron (ZOFRAN ODT) 4 MG disintegrating tablet Take 1 tablet (4 mg total) by mouth every 8 (eight) hours as needed for nausea or vomiting. 07/22/17   Wendi Lastra, Chase Picket, PA-C  Ranitidine HCl (ZANTAC PO) Take 1 tablet by mouth daily as needed (heartburn).    [provider]  ipratropium (ATROVENT) 0.06 % nasal spray Place 2 sprays into both nostrils 4 (four) times daily. Patient not taking: Reported on 06/11/2014 03/28/13 11/12/14  Rodolph Bong, MD    Family History Family History  Problem Relation Age of Onset  . Hypertension Mother   . Depression Mother   . Hypertension Maternal Grandmother   . Diabetes Maternal Grandmother   . Glaucoma Maternal Grandmother   . Thyroid disease Maternal Grandmother   . Depression Maternal Grandmother   . Glaucoma Paternal Grandmother   . Osteoarthritis Paternal Grandmother   . Bone cancer Paternal Grandmother     Social History Social History   Tobacco Use  . Smoking status: Current Every Day Smoker    Packs/day: 0.25  Types: Cigarettes  . Smokeless tobacco: Never Used  . Tobacco comment: provided information regarding [place] Quit line  Substance Use Topics  . Alcohol use: Yes    Comment: last used this past week, was drinking  4 glasses of wine per night, heavy drinking on weekends .  Marland Kitchen Drug use: Yes    Types: Marijuana    Comment: about once a month     Allergies   Flagyl [metronidazole] and Promethazine   Review of Systems Review of Systems  Constitutional: Negative for chills and fever.  HENT: Positive for congestion and ear pain. Negative for ear discharge and sore throat.   Gastrointestinal: Positive for abdominal pain, diarrhea, nausea and vomiting. Negative for blood in stool and constipation.  All other systems reviewed and are negative.    Physical Exam Updated Vital  Signs BP 127/80 (BP Location: Left Arm)   Pulse 72   Temp 98.8 F (37.1 C) (Oral)   Resp 20   Ht  (1.702 m)   Wt 63.5 kg (140 lb)   LMP 07/18/2017   SpO2 100%   BMI 21.93 kg/m   Physical Exam  Constitutional: She is oriented to person, place, and time. She appears well-developed and well-nourished. No distress.  Non-toxic appearing.  HENT:  Head: Normocephalic and atraumatic.  Fluid behind bilateral TM's. No erythema or bulging. No mastoid tenderness.  Cardiovascular: Normal rate, regular rhythm and normal heart sounds.  No murmur heard. Pulmonary/Chest: Effort normal and breath sounds normal. No respiratory distress.  Abdominal: Soft. Bowel sounds are normal. She exhibits no distension.  Minimal abdominal tenderness diffusely without focality. No rebound or guarding. No CVA tenderness.   Musculoskeletal: Normal range of motion.  Neurological: She is alert and oriented to person, place, and time.  Skin: Skin is warm and dry.  Nursing note and vitals reviewed.    ED Treatments / Results  Labs (all labs ordered are listed, but only abnormal results are displayed) Labs Reviewed  URINALYSIS, ROUTINE W REFLEX MICROSCOPIC - Abnormal; Notable for the following components:      Result Value   APPearance HAZY (*)    pH 8.5 (*)    Protein, ur 30 (*)    All other components within normal limits  URINALYSIS, MICROSCOPIC (REFLEX) - Abnormal; Notable for the following components:   Bacteria, UA RARE (*)    All other components within normal limits  CBC WITH DIFFERENTIAL/PLATELET  COMPREHENSIVE METABOLIC PANEL  PREGNANCY, URINE    EKG None  Radiology No results found.  Procedures Procedures (including critical care time)  Medications Ordered in ED Medications  sodium chloride 0.9 % bolus 1,000 mL (0 mLs Intravenous Stopped 07/22/17 1610)  ondansetron (ZOFRAN) injection 4 mg (4 mg Intravenous Given 07/22/17 1448)     Initial Impression / Assessment and Plan / ED  Course  I have reviewed the triage vital signs and the nursing notes.  Pertinent labs & imaging results that were available during my care of the patient were reviewed by me and considered in my medical decision making (see chart for details).   Jodi Cochran is a 32 y.o. female who presents to ED for nausea, vomiting and non-bloody diarrhea which began yesterday. On exam, patient is afebrile, non-toxic appearing with a non-surgical abdominal exam. IV fluids and nausea medications given. Labs reviewed and reassuring. Normal elytes and white count. No sign of UTI on UA. Upreg negative. On repeat examination, patient is now tolerating PO with no episodes of emesis since medication  administration. She feels as if symptoms have resolved. Repeat abdominal exam benign.   She additionally is complaining of bilateral ear pain. She has fluid behind both ears, but no erythema/bulging. No indication for ABX treatment at this time.   Evaluation does not show pathology that would require ongoing emergent intervention or inpatient treatment. Rx for zofran given. PCP follow up encouraged. Spoke at length with patient about signs or symptoms that should prompt return to emergency Department including inability to tolerate PO, blood in the stools, fevers, focal localization of abdominal pain, new/worsening symptoms or any additional concerns. Patient understands diagnosis and plan of care as dictated above. All questions answered.   Final Clinical Impressions(s) / ED Diagnoses   Final diagnoses:  Nausea vomiting and diarrhea    ED Discharge Orders        Ordered    ondansetron (ZOFRAN ODT) 4 MG disintegrating tablet  Every 8 hours PRN     07/22/17 1605       Bayle Calvo, Chase Picket, PA-C 07/22/17 1622    Gwyneth Sprout, MD 07/23/17 [phone]

## 2017-07-22 NOTE — ED Triage Notes (Signed)
Patient states that she has had had N/V today and having diarreah yesterday  - the patient states that " I have really bad anxiety, can the Dr give me something in my IV for that/" - the patient states that she is having bad abdominal pain as well and is having bilateral ear pain ( x 1 week)  - the patient states that she can not tolerate waiting in the waiting room with her anxiety

## 2017-07-22 NOTE — ED Notes (Signed)
ED Provider at bedside. 

## 2017-07-22 NOTE — ED Notes (Signed)
Drinking fluids without n/v

## 2017-07-31 ENCOUNTER — Ambulatory Visit (INDEPENDENT_AMBULATORY_CARE_PROVIDER_SITE_OTHER): Payer: No Typology Code available for payment source | Admitting: Family Medicine

## 2017-07-31 ENCOUNTER — Encounter: Payer: Self-pay | Admitting: Family Medicine

## 2017-07-31 VITALS — BP 136/84 | HR 89 | Temp 98.5°F | Wt 161.6 lb

## 2017-07-31 DIAGNOSIS — F32A Depression, unspecified: Secondary | ICD-10-CM

## 2017-07-31 DIAGNOSIS — F329 Major depressive disorder, single episode, unspecified: Secondary | ICD-10-CM | POA: Diagnosis not present

## 2017-07-31 DIAGNOSIS — R21 Rash and other nonspecific skin eruption: Secondary | ICD-10-CM | POA: Diagnosis not present

## 2017-07-31 DIAGNOSIS — F419 Anxiety disorder, unspecified: Secondary | ICD-10-CM

## 2017-07-31 MED ORDER — CITALOPRAM HYDROBROMIDE 20 MG PO TABS: ORAL_TABLET | ORAL | 2 refills | Status: DC

## 2017-07-31 NOTE — Progress Notes (Signed)
   Subjective:    Patient ID: Jodi Cochran, female    DOB: 16-Apr-1985, 32 y.o.   MRN: [id number]  HPI Chief Complaint  Patient presents with  . Mass    with red ring around bump on inner right thigh/increased in size throughout yesterday   She is here with complaints of a possible insect bite to her right inner thigh that is mildly pruritic. Did not see an insect. History of MRSA years ago. No recurrent abscess history. She is immunocompetent.  She would like a refill of Celexa. States she has noticed improvement in her mood. Feels more stable, less depressed.   States she drinks alcohol on the weekends. Typically has 3-4 shots of liquor and then beer. States she thinks she blacked out last weekend and the weekend before. Has felt fine all other days.   Denies fever, chills, dizziness, chest pain, palpitations, shortness of breath, abdominal pain, back pain, N/V/D, urinary symptoms, vaginal discharge.  No numbness, tingling or weakness.   Reviewed allergies, medications, past medical, surgical, family, and social history.    Review of Systems Pertinent positives and negatives in the history of present illness.     Objective:   Physical Exam BP 136/84   Pulse 89   Temp 98.5 F (36.9 C) (Oral)   Wt 161 lb 9.6 oz (73.3 kg)   LMP 07/18/2017   SpO2 96%   BMI 25.31 kg/m   Alert and oriented and in no acute distress. Insect bite on her right medial thigh without induration, fluctuance, drainage or sign of infection.       Assessment & Plan:  Rash and nonspecific skin eruption  Anxiety and depression - Plan: citalopram (CELEXA) 20 MG tablet  She will use topical antibiotic ointment and warm compresses. No sign of infection or abscess currently. She will let me know if area worsens.  Refilled Celexa and advised her to stop drinking alcohol with this medication. She is doing well except when she binge drinks on the weekend. She is aware that this behavior is risky and  potentially dangerous.  She will follow up as needed.

## 2017-07-31 NOTE — Patient Instructions (Signed)
Use the bacitracin and warm compresses. Do not pick at the area. Keep an eye on it and if you notice it getting worse call and let me know.  You can take Benadryl if needed for itching at bedtime.

## 2017-09-25 ENCOUNTER — Emergency Department (HOSPITAL_BASED_OUTPATIENT_CLINIC_OR_DEPARTMENT_OTHER)
Admission: EM | Admit: 2017-09-25 | Discharge: 2017-09-25 | Disposition: A | Discharge status: Home / Self Care | Payer: PRIVATE HEALTH INSURANCE | Attending: Emergency Medicine | Admitting: Emergency Medicine

## 2017-09-25 ENCOUNTER — Encounter (HOSPITAL_BASED_OUTPATIENT_CLINIC_OR_DEPARTMENT_OTHER): Payer: Self-pay | Admitting: Emergency Medicine

## 2017-09-25 ENCOUNTER — Other Ambulatory Visit: Payer: Self-pay

## 2017-09-25 DIAGNOSIS — Z87891 Personal history of nicotine dependence: Secondary | ICD-10-CM | POA: Insufficient documentation

## 2017-09-25 DIAGNOSIS — N3090 Cystitis, unspecified without hematuria: Secondary | ICD-10-CM

## 2017-09-25 DIAGNOSIS — N760 Acute vaginitis: Secondary | ICD-10-CM | POA: Insufficient documentation

## 2017-09-25 DIAGNOSIS — B9689 Other specified bacterial agents as the cause of diseases classified elsewhere: Secondary | ICD-10-CM

## 2017-09-25 DIAGNOSIS — Z79899 Other long term (current) drug therapy: Secondary | ICD-10-CM | POA: Insufficient documentation

## 2017-09-25 HISTORY — DX: Anxiety disorder, unspecified: F41.9

## 2017-09-25 LAB — URINALYSIS, COMPLETE (UACMP) WITH MICROSCOPIC
BILIRUBIN URINE: NEGATIVE
Glucose, UA: NEGATIVE mg/dL
Ketones, ur: NEGATIVE mg/dL
NITRITE: POSITIVE — AB
PH: 6.5 (ref 5.0–8.0)
Protein, ur: 100 mg/dL — AB
RBC / HPF: >50 RBC/hpf (ref 0–5)
SPECIFIC GRAVITY, URINE: 1.02 (ref 1.005–1.030)

## 2017-09-25 LAB — WET PREP, GENITAL
Sperm: NONE SEEN
Trich, Wet Prep: NONE SEEN
Yeast Wet Prep HPF POC: NONE SEEN

## 2017-09-25 LAB — PREGNANCY, URINE: Preg Test, Ur: NEGATIVE

## 2017-09-25 MED ORDER — CEPHALEXIN 500 MG PO CAPS: 500.0000 mg | ORAL_CAPSULE | Freq: Four times a day (QID) | ORAL | 0 refills | Status: AC

## 2017-09-25 MED ORDER — METRONIDAZOLE 500 MG PO TABS
2000.0000 mg | ORAL_TABLET | Freq: Once | ORAL | Status: AC
Start: 2017-09-25 — End: 2017-09-25
  Administered 2017-09-25: 2000 mg via ORAL
  Filled 2017-09-25: qty 4

## 2017-09-25 MED ORDER — ONDANSETRON HCL 8 MG PO TABS
8.0000 mg | ORAL_TABLET | Freq: Once | ORAL | Status: AC
  Administered 2017-09-25: 8 mg via ORAL
  Filled 2017-09-25: qty 1

## 2017-09-25 MED FILL — CEPHALEXIN 500 MG CAPSULE: 500 | 7 days supply | Qty: 28 | Fill #0

## 2017-09-25 NOTE — ED Notes (Signed)
ED Provider at bedside. 

## 2017-09-25 NOTE — ED Provider Notes (Signed)
[facility] EMERGENCY DEPARTMENT Provider Note   CSN: [id number] Arrival date & time: 09/25/17  [id number]     History   Chief Complaint Chief Complaint  Patient presents with  . Urinary Frequency  . Vaginal Discharge    HPI Jodi DockerLatasha Cochran is a 32 y.o. female.  HPI  Patient presents today with urinary frequency beginning yesterday.  She also endorses some lower abdominal pain.  She tried naproxen for this pain which helped.  Denies fever, endorses decreased appetite.  She also relates clear vaginal discharge.  She has no new sexual partners, however this is sexually active with both men and women.  Last sexual encounter was Friday using a condom.  She is not on any birth control but does not desire pregnancy at this time.  She states she has had UTIs in the past, but this was as a child and she has not been treated for any in many years.  She denies any known anatomic abnormality causing UTIs in the past.  She also endorses an allergy to Flagyl, which she relates as nausea.  She denies drinking alcohol while taking this in the past.  She is only tried this medication once. Past Medical History:  Diagnosis Date  . Anxiety   . GERD (gastroesophageal reflux disease)   . HPV in female   . IBS (irritable bowel syndrome)     Patient Active Problem List   Diagnosis Date Noted  . HPV in female   . ESOPHAGEAL REFLUX 04/26/2009  . HEMORRHAGE OF RECTUM AND ANUS 04/26/2009  . ABDOMINAL PAIN, EPIGASTRIC 04/26/2009    Past Surgical History:  Procedure Laterality Date  . NO PAST SURGERIES       OB History    Gravida  2   Para  1   Term      Preterm  1   AB      Living  1     SAB      TAB      Ectopic      Multiple      Live Births  1            Home Medications    Prior to Admission medications   Medication Sig Start Date End Date Taking? Authorizing Provider  citalopram (CELEXA) 20 MG tablet 1 tablet by mouth daily 07/31/17  Yes Henson, [name] L, NP-C   cephALEXin (KEFLEX) 500 MG capsule Take 1 capsule (500 mg total) by mouth 4 (four) times daily for 7 days. Take for 7 days 09/25/17 10/02/17  Garth Bignessimberlake, Astraea Gaughran, MD  Ibuprofen (ADVIL PO) Take by mouth.    [provider]  Multiple Vitamins-Minerals (MULTIVITAMIN GUMMIES ADULT PO) Take by mouth.    [provider]  ondansetron (ZOFRAN ODT) 4 MG disintegrating tablet Take 1 tablet (4 mg total) by mouth every 8 (eight) hours as needed for nausea or vomiting. 07/22/17   Ward, Chase PicketJaime Pilcher, PA-C  Ranitidine HCl (ZANTAC PO) Take 1 tablet by mouth daily as needed (heartburn).    [provider]    Family History Family History  Problem Relation Age of Onset  . Hypertension Mother   . Depression Mother   . Hypertension Maternal Grandmother   . Diabetes Maternal Grandmother   . Glaucoma Maternal Grandmother   . Thyroid disease Maternal Grandmother   . Depression Maternal Grandmother   . Glaucoma Paternal Grandmother   . Osteoarthritis Paternal Grandmother   . Bone cancer Paternal Grandmother  Social History Social History   Tobacco Use  . Smoking status: Former Smoker    Packs/day: 0.25    Types: Cigarettes    Last attempt to quit: 07/01/2017    Years since quitting: 0.2  . Smokeless tobacco: Never Used  . Tobacco comment: provided information regarding [place] Quit line  Substance Use Topics  . Alcohol use: Yes    Comment: last used this past week, was drinking  4 glasses of wine per night, heavy drinking on weekends .  Marland Kitchen Drug use: Yes    Types: Marijuana    Comment: about once a month     Allergies   Promethazine   Review of Systems Review of Systems  Constitutional: Positive for appetite change.  Gastrointestinal: Positive for nausea. Negative for diarrhea and vomiting.  Genitourinary: Positive for dysuria.  All other systems reviewed and are negative.    Physical Exam Updated Vital Signs BP (!) 141/99 (BP Location: Right Arm)   Pulse 67    Temp 98.1 F (36.7 C) (Oral)   Resp 18   Ht 5\' 8"  (1.727 m)   Wt 72.6 kg (160 lb)   LMP 09/04/2017   SpO2 100%   BMI 24.33 kg/m   Physical Exam  Constitutional: She is oriented to person, place, and time. She appears well-developed and well-nourished. No distress.  HENT:  Head: Normocephalic and atraumatic.  Cardiovascular: Normal rate, regular rhythm and normal heart sounds.  Pulmonary/Chest: Effort normal and breath sounds normal.  Abdominal: Soft. Bowel sounds are normal. She exhibits no distension.  Suprapubic tenderness  Genitourinary: Vagina normal.  Genitourinary Comments: Thin white vaginal discharge, no CMT or adnexal tenderness. No lesions.   Neurological: She is alert and oriented to person, place, and time.  Skin: Skin is warm and dry.  Psychiatric: She has a normal mood and affect.     ED Treatments / Results  Labs (all labs ordered are listed, but only abnormal results are displayed) Labs Reviewed  WET PREP, GENITAL - Abnormal; Notable for the following components:      Result Value   Clue Cells Wet Prep HPF POC PRESENT (*)    WBC, Wet Prep HPF POC MANY (*)    All other components within normal limits  URINALYSIS, COMPLETE (UACMP) WITH MICROSCOPIC - Abnormal; Notable for the following components:   APPearance CLOUDY (*)    Hgb urine dipstick LARGE (*)    Protein, ur 100 (*)    Nitrite POSITIVE (*)    Leukocytes, UA MODERATE (*)    Bacteria, UA MANY (*)    All other components within normal limits  URINE CULTURE  PREGNANCY, URINE  GC/CHLAMYDIA PROBE AMP ([place]) NOT AT [facility]    EKG None  Radiology No results found.  Procedures Procedures (including critical care time)  Medications Ordered in ED Medications  metroNIDAZOLE (FLAGYL) tablet 2,000 mg (2,000 mg Oral Given 09/25/17 1104)  ondansetron (ZOFRAN) tablet 8 mg (8 mg Oral Given 09/25/17 1103)     Initial Impression / Assessment and Plan / ED Course  I have reviewed the triage vital  signs and the nursing notes.  Pertinent labs & imaging results that were available during my care of the patient were reviewed by me and considered in my medical decision making (see chart for details).     UA clinically suggestive of UTI without CVA tenderness with suprapubic pain. Will get wet prep and g/c given hx of discharge.  Wet prep with BV, offered patient 2g flagyl  here with zofran before and monitoring as reported allergy is solely nausea.  Patient willing to try this. Will plan to discharge with keflex for UTI and follow up if no improvement.   Final Clinical Impressions(s) / ED Diagnoses   Final diagnoses:  BV (bacterial vaginosis)  Cystitis    ED Discharge Orders        Ordered    cephALEXin (KEFLEX) 500 MG capsule  4 times daily     09/25/17 1125       Garth Bigness, MD 09/25/17 1132    Melene Plan, DO 09/25/17 1220

## 2017-09-25 NOTE — Discharge Instructions (Addendum)
You had bacterial vaginosis, which is an overgrowth of bacteria in your vagina causing discharge.  We gave you medication for this.  We sent a prescription in for your urinary tract infection, return if you have persistent symptoms including nausea, fever, pain with urination.  We will send a culture of your urine, we will call you if anything comes back abnormal.

## 2017-09-25 NOTE — ED Triage Notes (Signed)
Urinary frequency since yesterday, vaginal discharge today. Denies pain today, had some abd pain yesterday.

## 2017-09-26 LAB — GC/CHLAMYDIA PROBE AMP (~~LOC~~) NOT AT ARMC
CHLAMYDIA, DNA PROBE: NEGATIVE
NEISSERIA GONORRHEA: NEGATIVE

## 2017-09-27 LAB — URINE CULTURE: Culture: >=100000 — AB

## 2017-09-28 ENCOUNTER — Telehealth: Payer: Self-pay

## 2017-09-28 NOTE — Telephone Encounter (Signed)
Post ED Visit - Positive Culture Follow-up  Culture report reviewed by antimicrobial stewardship pharmacist:  []  Enzo BiNathan Batchelder, Pharm.D. []  Celedonio MiyamotoJeremy Frens, Pharm. ., BCPS AQ-ID []  Garvin FilaMike Maccia, Pharm.D., BCPS []  Georgina PillionElizabeth Martin, [address]D., BCPS []  [place] CourtlandMinh Pham, [address]Ph[address]D., BCPS, AAHIVP []  Estella HuskMichelle Turner, Pharm.D., BCPS, AAHIVP [x]  Lysle Pearlachel Rumbarger, PharmD, BCPS []  Phillips Climeshuy Dang, PharmD, BCPS []  Agapito GamesAlison Masters, PharmD, BCPS []  Verlan FriendsErin Deja, PharmD  Positive urine culture Treated with Cephalexin, organism sensitive to the same and no further patient follow-up is required at this time.  Jerry CarasCullom, Mattia Osterman Burnett 09/28/2017, 10:32 AM

## 2017-10-02 ENCOUNTER — Telehealth: Payer: Self-pay | Admitting: Family Medicine

## 2017-10-02 NOTE — Telephone Encounter (Signed)
Pt called and asked for refill on her Xanax, states she having issues with increased anxiety recently states didn't know if the antibiotic could be having any effect with her Celexa, having increased headaches and tiredness also while on the antibiotic.  [facility] [place]

## 2017-10-03 NOTE — Telephone Encounter (Signed)
She needs a follow-up appointment 

## 2017-10-03 NOTE — Telephone Encounter (Signed)
appt made for Monday wwith vickie . KH

## 2017-10-04 ENCOUNTER — Encounter (HOSPITAL_BASED_OUTPATIENT_CLINIC_OR_DEPARTMENT_OTHER): Payer: Self-pay | Admitting: *Deleted

## 2017-10-04 ENCOUNTER — Emergency Department (HOSPITAL_BASED_OUTPATIENT_CLINIC_OR_DEPARTMENT_OTHER)
Admission: EM | Admit: 2017-10-04 | Discharge: 2017-10-04 | Disposition: A | Discharge status: Home / Self Care | Payer: Medicaid Other | Attending: Emergency Medicine | Admitting: Emergency Medicine

## 2017-10-04 ENCOUNTER — Other Ambulatory Visit: Payer: Self-pay

## 2017-10-04 DIAGNOSIS — Z87891 Personal history of nicotine dependence: Secondary | ICD-10-CM | POA: Insufficient documentation

## 2017-10-04 DIAGNOSIS — R110 Nausea: Secondary | ICD-10-CM | POA: Insufficient documentation

## 2017-10-04 DIAGNOSIS — R1084 Generalized abdominal pain: Secondary | ICD-10-CM | POA: Insufficient documentation

## 2017-10-04 DIAGNOSIS — R102 Pelvic and perineal pain: Secondary | ICD-10-CM | POA: Insufficient documentation

## 2017-10-04 DIAGNOSIS — R51 Headache: Secondary | ICD-10-CM | POA: Insufficient documentation

## 2017-10-04 LAB — COMPREHENSIVE METABOLIC PANEL
ALK PHOS: 75 U/L (ref 38–126)
ALT: 14 U/L (ref 0–44)
ANION GAP: 8 (ref 5–15)
AST: 20 U/L (ref 15–41)
Albumin: 3.6 g/dL (ref 3.5–5.0)
BUN: 12 mg/dL (ref 6–20)
CALCIUM: 8.6 mg/dL — AB (ref 8.9–10.3)
CO2: 25 mmol/L (ref 22–32)
Chloride: 105 mmol/L (ref 98–111)
Creatinine, Ser: 0.61 mg/dL (ref 0.44–1.00)
GFR calc non Af Amer: >60 mL/min (ref >60)
GLUCOSE: 81 mg/dL (ref 70–99)
POTASSIUM: 3.7 mmol/L (ref 3.5–5.1)
Sodium: 138 mmol/L (ref 135–145)
TOTAL PROTEIN: 7 g/dL (ref 6.5–8.1)
Total Bilirubin: 0.3 mg/dL (ref 0.3–1.2)

## 2017-10-04 LAB — URINALYSIS, ROUTINE W REFLEX MICROSCOPIC
BILIRUBIN URINE: NEGATIVE
Glucose, UA: NEGATIVE mg/dL
Hgb urine dipstick: NEGATIVE
KETONES UR: NEGATIVE mg/dL
LEUKOCYTES UA: NEGATIVE
NITRITE: NEGATIVE
PROTEIN: NEGATIVE mg/dL
Specific Gravity, Urine: 1.015 (ref 1.005–1.030)
pH: 7 (ref 5.0–8.0)

## 2017-10-04 LAB — CBC
HCT: 38.8 % (ref 36.0–46.0)
Hemoglobin: 13 g/dL (ref 12.0–15.0)
MCH: 28.8 pg (ref 26.0–34.0)
MCHC: 33.5 g/dL (ref 30.0–36.0)
MCV: 86 fL (ref 78.0–100.0)
PLATELETS: 278 10*3/uL (ref 150–400)
RBC: 4.51 MIL/uL (ref 3.87–5.11)
RDW: 13.2 % (ref 11.5–15.5)
WBC: 6 10*3/uL (ref 4.0–10.5)

## 2017-10-04 LAB — CBG MONITORING, ED: GLUCOSE-CAPILLARY: 117 mg/dL — AB (ref 70–99)

## 2017-10-04 LAB — PREGNANCY, URINE: Preg Test, Ur: NEGATIVE

## 2017-10-04 MED ORDER — ONDANSETRON 4 MG PO TBDP: 4.0000 mg | ORAL_TABLET | Freq: Three times a day (TID) | ORAL | 0 refills | Status: DC | PRN

## 2017-10-04 MED ORDER — DICYCLOMINE HCL 20 MG PO TABS: 20.0000 mg | ORAL_TABLET | Freq: Two times a day (BID) | ORAL | 0 refills | Status: DC

## 2017-10-04 MED ORDER — NAPROXEN 500 MG PO TABS: 500.0000 mg | ORAL_TABLET | Freq: Two times a day (BID) | ORAL | 0 refills | Status: DC

## 2017-10-04 MED ORDER — HYDROCODONE-ACETAMINOPHEN 5-325 MG PO TABS
1.0000 | ORAL_TABLET | Freq: Once | ORAL | Status: AC
  Administered 2017-10-04: 1 via ORAL
  Filled 2017-10-04: qty 1

## 2017-10-04 NOTE — Discharge Instructions (Signed)
Please read and follow all provided instructions.  Your diagnoses today include:  1. Generalized abdominal pain     Tests performed today include:  Blood counts and electrolytes  Blood tests to check liver and kidney function  Blood tests to check pancreas function  Urine test to look for infection and pregnancy (in women)  Vital signs. See below for your results today.   Medications prescribed:   Bentyl - medication for intestinal cramps and spasms   Naproxen - anti-inflammatory pain medication  Do not exceed 500mg  naproxen every 12 hours, take with food  You have been prescribed an anti-inflammatory medication or NSAID. Take with food. Take smallest effective dose for the shortest duration needed for your pain. Stop taking if you experience stomach pain or vomiting.    Zofran (ondansetron) - for nausea and vomiting  Take any prescribed medications only as directed.  Home care instructions:   Follow any educational materials contained in this packet.  Follow-up instructions: Please follow-up with your primary care provider in the next 3 days for further evaluation of your symptoms.    Return instructions:  SEEK IMMEDIATE MEDICAL ATTENTION IF:  The pain does not go away or becomes severe   A temperature above 101F develops   Repeated vomiting occurs (multiple episodes)   The pain becomes localized to portions of the abdomen. The right side could possibly be appendicitis. In an adult, the left lower portion of the abdomen could be colitis or diverticulitis.   Blood is being passed in stools or vomit (bright red or black tarry stools)   You develop chest pain, difficulty breathing, dizziness or fainting, or become confused, poorly responsive, or inconsolable (young children)  If you have any other emergent concerns regarding your health  Additional Information: Abdominal (belly) pain can be caused by many things. Your caregiver performed an examination and  possibly ordered blood/urine tests and imaging (CT scan, x-rays, ultrasound). Many cases can be observed and treated at home after initial evaluation in the emergency department. Even though you are being discharged home, abdominal pain can be unpredictable. Therefore, you need a repeated exam if your pain does not resolve, returns, or worsens. Most patients with abdominal pain don't have to be admitted to the hospital or have surgery, but serious problems like appendicitis and gallbladder attacks can start out as nonspecific pain. Many abdominal conditions cannot be diagnosed in one visit, so follow-up evaluations are very important.  Your vital signs today were: BP (!) 143/103 (BP Location: Right Arm)    Pulse 75    Temp 98.3 F (36.8 C) (Oral)    Resp 18    Ht 5\' 8"  (1.727 m)    Wt 72.6 kg (160 lb)    LMP 09/04/2017    SpO2 99%    BMI 24.33 kg/m  If your blood pressure (bp) was elevated above 135/85 this visit, please have this repeated by your doctor within one month. --------------

## 2017-10-04 NOTE — ED Notes (Signed)
ED Provider at bedside. 

## 2017-10-04 NOTE — ED Provider Notes (Signed)
[facility] EMERGENCY DEPARTMENT Provider Note   CSN: [id number] Arrival date & time: 10/04/17  1157     History   Chief Complaint Chief Complaint  Patient presents with  . Abdominal Pain    HPI Jodi Cochran is a 32 y.o. female.  Patient with no past surgical history presents to the emergency department with complaint of periumbilical and lower abdominal crampy type pain starting acutely 2 days ago but gradually worsening yesterday.  Patient has had some radiation of symptoms to her back.  She has had some nausea but no vomiting.  No fevers.  She has had some headache and blurry vision but no loss of vision or syncope.  She was recently seen and treated for urinary tract infection.  Treatment helped with dysuria which she no longer has.  She denies any current vaginal bleeding or discharge.  Patient states that she had vaginal bleeding after she was seen here on 09/25/2017 which lasted 4 days, 1 day longer than her typical period.  Patient has been taking Tylenol at home for her symptoms which helps.  No other complaints.  Nothing makes symptoms worse.     Past Medical History:  Diagnosis Date  . Anxiety   . GERD (gastroesophageal reflux disease)   . HPV in female   . IBS (irritable bowel syndrome)     Patient Active Problem List   Diagnosis Date Noted  . HPV in female   . ESOPHAGEAL REFLUX 04/26/2009  . HEMORRHAGE OF RECTUM AND ANUS 04/26/2009  . ABDOMINAL PAIN, EPIGASTRIC 04/26/2009    Past Surgical History:  Procedure Laterality Date  . NO PAST SURGERIES       OB History    Gravida  2   Para  1   Term      Preterm  1   AB      Living  1     SAB      TAB      Ectopic      Multiple      Live Births  1            Home Medications    Prior to Admission medications   Medication Sig Start Date End Date Taking? Authorizing Provider  citalopram (CELEXA) 20 MG tablet 1 tablet by mouth daily 07/31/17  Yes Henson, [name] L, NP-C    Ibuprofen (ADVIL PO) Take by mouth.   Yes [provider]  Ranitidine HCl (ZANTAC PO) Take 1 tablet by mouth daily as needed (heartburn).   Yes [provider]  Multiple Vitamins-Minerals (MULTIVITAMIN GUMMIES ADULT PO) Take by mouth.    [provider]  ondansetron (ZOFRAN ODT) 4 MG disintegrating tablet Take 1 tablet (4 mg total) by mouth every 8 (eight) hours as needed for nausea or vomiting. 07/22/17   Ward, Chase PicketJaime Pilcher, PA-C    Family History Family History  Problem Relation Age of Onset  . Hypertension Mother   . Depression Mother   . Hypertension Maternal Grandmother   . Diabetes Maternal Grandmother   . Glaucoma Maternal Grandmother   . Thyroid disease Maternal Grandmother   . Depression Maternal Grandmother   . Glaucoma Paternal Grandmother   . Osteoarthritis Paternal Grandmother   . Bone cancer Paternal Grandmother     Social History Social History   Tobacco Use  . Smoking status: Former Smoker    Packs/day: 0.25    Types: Cigarettes    Last attempt to quit: 07/01/2017    Years  since quitting: 0.2  . Smokeless tobacco: Never Used  . Tobacco comment: provided information regarding [place] Quit line  Substance Use Topics  . Alcohol use: Yes    Comment: last used this past week, was drinking  4 glasses of wine per night, heavy drinking on weekends .  Marland Kitchen Drug use: Yes    Types: Marijuana    Comment: about once a month     Allergies   Promethazine   Review of Systems Review of Systems  Constitutional: Negative for fever.  HENT: Negative for rhinorrhea and sore throat.   Eyes: Positive for visual disturbance. Negative for redness.  Respiratory: Negative for cough.   Cardiovascular: Negative for chest pain.  Gastrointestinal: Positive for abdominal pain and nausea. Negative for diarrhea and vomiting.  Genitourinary: Positive for pelvic pain. Negative for dysuria, frequency, urgency, vaginal bleeding and vaginal discharge.   Musculoskeletal: Positive for back pain. Negative for myalgias.  Skin: Negative for rash.  Neurological: Positive for headaches.     Physical Exam Updated Vital Signs BP (!) 137/95   Pulse 83   Temp 98.3 F (36.8 C) (Oral)   Resp 18   Ht 5\' 8"  (1.727 m)   Wt 72.6 kg (160 lb)   LMP 09/04/2017   SpO2 100%   BMI 24.33 kg/m   Physical Exam  Constitutional: She appears well-developed and well-nourished.  HENT:  Head: Normocephalic and atraumatic.  Eyes: Conjunctivae are normal. Right eye exhibits no discharge. Left eye exhibits no discharge.  Neck: Normal range of motion. Neck supple.  Cardiovascular: Normal rate, regular rhythm and normal heart sounds.  Pulmonary/Chest: Effort normal and breath sounds normal.  Abdominal: Soft. There is tenderness (Mild) in the right lower quadrant, periumbilical area, suprapubic area and left lower quadrant. There is no rebound, no guarding and negative Murphy's sign.  Neurological: She is alert.  Skin: Skin is warm and dry.  Psychiatric: She has a normal mood and affect.  Nursing note and vitals reviewed.    ED Treatments / Results  Labs (all labs ordered are listed, but only abnormal results are displayed) Labs Reviewed  COMPREHENSIVE METABOLIC PANEL - Abnormal; Notable for the following components:      Result Value   Calcium 8.6 (*)    All other components within normal limits  CBG MONITORING, ED - Abnormal; Notable for the following components:   Glucose-Capillary 117 (*)    All other components within normal limits  CBC  URINALYSIS, ROUTINE W REFLEX MICROSCOPIC  PREGNANCY, URINE  CBG MONITORING, ED    EKG None  Radiology No results found.  Procedures Procedures (including critical care time)  Medications Ordered in ED Medications  HYDROcodone-acetaminophen (NORCO/VICODIN) 5-325 MG per tablet 1 tablet (1 tablet Oral Given 10/04/17 1519)     Initial Impression / Assessment and Plan / ED Course  I have reviewed the  triage vital signs and the nursing notes.  Pertinent labs & imaging results that were available during my care of the patient were reviewed by me and considered in my medical decision making (see chart for details).     Patient seen and examined. Work-up initiated.  Patient declines medication for pain or nausea at the current time.  Lab and urine work-up pending.  Vital signs reviewed and are as follows: BP (!) 137/95   Pulse 83   Temp 98.3 F (36.8 C) (Oral)   Resp 18   Ht 5\' 8"  (1.727 m)   Wt 72.6 kg (160 lb)   LMP  09/04/2017   SpO2 100%   BMI 24.33 kg/m   3:10 PM work-up negative to this point.  There was a delay in obtaining CMP.  This is now pending.  Patient given oral pain medication.  Exam is stable.  4:22 PM patient updated on results.  She is feeling better however continues to have some cramping in her periumbilical and lower abdominal area.  Pain continues to be nonfocal.  Discussed results.  No indications for imaging at this point.  Patient will be discharged home with symptomatic care including Bentyl, naproxen, Zofran.  The patient was urged to return to the Emergency Department immediately with worsening of current symptoms, worsening abdominal pain, persistent vomiting, blood noted in stools, fever, or any other concerns. The patient verbalized understanding.    Final Clinical Impressions(s) / ED Diagnoses   Final diagnoses:  Generalized abdominal pain   Patient with abdominal pain. Vitals are stable, no fever. Labs reassuring. Imaging not felt indicated given no focal symptoms, fevers, other systemic sx. No signs of dehydration, patient is tolerating PO's. Lungs are clear and no signs suggestive of PNA. No vaginal complaints. Low concern for appendicitis, cholecystitis, pancreatitis, ruptured viscus, UTI, kidney stone, aortic dissection, aortic aneurysm or other emergent abdominal etiology. Supportive therapy indicated with return if symptoms worsen.    ED  Discharge Orders        Ordered    dicyclomine (BENTYL) 20 MG tablet  2 times daily,   Status:  Discontinued     10/04/17 1617    naproxen (NAPROSYN) 500 MG tablet  2 times daily     10/04/17 1617    ondansetron (ZOFRAN ODT) 4 MG disintegrating tablet  Every 8 hours PRN     10/04/17 1617    dicyclomine (BENTYL) 20 MG tablet  2 times daily     10/04/17 1620       Renne Crigler, PA-C 10/04/17 1624    Loren Racer, MD 10/06/17 [phone]

## 2017-10-04 NOTE — ED Triage Notes (Signed)
Abdominal pain and nausea since yesterday. Recent UTI. Urinary frequency.

## 2017-10-08 ENCOUNTER — Ambulatory Visit: Payer: Self-pay | Admitting: Family Medicine

## 2017-11-03 ENCOUNTER — Other Ambulatory Visit: Payer: Self-pay | Admitting: Family Medicine

## 2017-11-03 DIAGNOSIS — F32A Depression, unspecified: Secondary | ICD-10-CM

## 2017-11-03 DIAGNOSIS — F419 Anxiety disorder, unspecified: Principal | ICD-10-CM

## 2017-11-03 DIAGNOSIS — F329 Major depressive disorder, single episode, unspecified: Secondary | ICD-10-CM

## 2017-12-05 ENCOUNTER — Other Ambulatory Visit: Payer: Self-pay | Admitting: Family Medicine

## 2017-12-05 DIAGNOSIS — F329 Major depressive disorder, single episode, unspecified: Secondary | ICD-10-CM

## 2017-12-05 DIAGNOSIS — F419 Anxiety disorder, unspecified: Principal | ICD-10-CM

## 2017-12-05 DIAGNOSIS — F32A Depression, unspecified: Secondary | ICD-10-CM

## 2018-01-14 ENCOUNTER — Telehealth: Payer: Self-pay

## 2018-01-14 NOTE — Telephone Encounter (Signed)
I am aware of the recall however, there are no recommended tests.  If she is having any specific symptoms, she can come in and we can talk more about this.

## 2018-01-14 NOTE — Telephone Encounter (Signed)
Patient wants to know if she should be tested for Cancer because she was taking zantac pretty regular and it now has a recall. Please advise.

## 2018-01-14 NOTE — Telephone Encounter (Signed)
Pt was notified. She has not been taking med for the last 15 days. If she gets heartburn again that recommend coming in for eval.

## 2018-03-07 ENCOUNTER — Encounter: Payer: Self-pay | Admitting: Family Medicine

## 2018-03-07 ENCOUNTER — Ambulatory Visit (INDEPENDENT_AMBULATORY_CARE_PROVIDER_SITE_OTHER): Payer: No Typology Code available for payment source | Admitting: Family Medicine

## 2018-03-07 VITALS — BP 124/80 | HR 64 | Temp 98.3°F | Resp 16 | Wt 165.0 lb

## 2018-03-07 DIAGNOSIS — R42 Dizziness and giddiness: Secondary | ICD-10-CM | POA: Diagnosis not present

## 2018-03-07 DIAGNOSIS — Z833 Family history of diabetes mellitus: Secondary | ICD-10-CM

## 2018-03-07 DIAGNOSIS — Z23 Encounter for immunization: Secondary | ICD-10-CM | POA: Diagnosis not present

## 2018-03-07 DIAGNOSIS — R51 Headache: Secondary | ICD-10-CM | POA: Diagnosis not present

## 2018-03-07 DIAGNOSIS — R5383 Other fatigue: Secondary | ICD-10-CM | POA: Diagnosis not present

## 2018-03-07 DIAGNOSIS — R519 Headache, unspecified: Secondary | ICD-10-CM

## 2018-03-07 LAB — POCT GLYCOSYLATED HEMOGLOBIN (HGB A1C): Hemoglobin A1C: 5.5 % (ref 4.0–5.6)

## 2018-03-07 LAB — GLUCOSE, POCT (MANUAL RESULT ENTRY): POC GLUCOSE: 103 mg/dL — AB (ref 70–99)

## 2018-03-07 NOTE — Progress Notes (Signed)
   Subjective:    Patient ID: Jodi Cochran, female    DOB: 02/25/1986, 32 y.o.   MRN: [id number]  HPI Chief Complaint  Patient presents with  . headache    headache for 2 weeks, at night eyes start burning, not a constant headache. last week had a HA for 3 hours and tyenol and it didn't touch it.    She is here with complaints of intermittent headache for the past 2 weeks. Pain is described as dull and she feels "foggy". She is feeling congested and headache is bilateral frontal. Having headaches daily and they are lasting an hour per time.  Purulent nasal drainage. Ears are feeling achy as well. Typically her headaches resolve after an hour or so with laying down. Tylenol did not help.  History of similar headaches.   She felt dizzy at work earlier along with the headache. Felt like she might pass out. Reports having increased fatigue as well and she slept over 12 hours last night.   Drinking plenty of water.  Eyes with clear drainage, burning at night.   Denies fever, chills, chest pain, palpitations, shortness of breath, abdominal pain, N/V/D, urinary symptoms, LE edema.  Reviewed allergies, medications, past medical, surgical, family, and social history.    Review of Systems Pertinent positives and negatives in the history of present illness.     Objective:   Physical Exam BP 124/80   Pulse 64   Temp 98.3 F (36.8 C) (Oral)   Resp 16   Wt 165 lb (74.8 kg)   SpO2 96%   BMI 25.09 kg/m  Alert and oriented and in no distress. No sinus tenderness. Tympanic membranes and canals are normal. Pharyngeal area is mildly erythematous, no exudate or edema.  Neck is supple without adenopathy or thyromegaly. Cardiac exam shows a regular sinus rhythm without murmurs or gallops. Lungs are clear to auscultation. Extremities without edema, pulses intact. Skin is warm and dry, no pallor. PERRLA, EOMs intact. CNs intact.        Assessment & Plan:  Intermittent headache  Family history of  diabetes mellitus (DM) - Plan: POCT glycosylated hemoglobin (Hb A1C)  Episode of dizziness - Plan: Comprehensive metabolic panel, POCT glucose (manual entry), POCT glycosylated hemoglobin (Hb A1C)  Fatigue, unspecified type - Plan: CBC with Differential/Platelet, Comprehensive metabolic panel, POCT glycosylated hemoglobin (Hb A1C), TSH, T4, free, VITAMIN D 25 Hydroxy (Vit-D Deficiency, Fractures)  Needs flu shot - Plan: Flu Vaccine QUAD 36+ mos IM  PCOT random glucose 103 Hgb A1c 5.5% Normal neuro exam. No acute distress. Does not currently have a headache. They do not sound like migraines.  Discussed staying well hydrated, getting regular sleep, avoid skipping meals or fluctuations in caffeine. She will try 1-2 Aleve with onset of headache. If she continues getting headaches she will follow up. Consider treatment for sinusitis if symptoms persist.  Check labs for fatigue and family history of diabetes.  Flu shot given.  Follow up pending labs.

## 2018-03-08 ENCOUNTER — Telehealth: Payer: Self-pay

## 2018-03-08 ENCOUNTER — Other Ambulatory Visit: Payer: Self-pay | Admitting: Family Medicine

## 2018-03-08 ENCOUNTER — Encounter: Payer: Self-pay | Admitting: Family Medicine

## 2018-03-08 DIAGNOSIS — E559 Vitamin D deficiency, unspecified: Secondary | ICD-10-CM

## 2018-03-08 HISTORY — DX: Vitamin D deficiency, unspecified: E55.9

## 2018-03-08 LAB — CBC WITH DIFFERENTIAL/PLATELET
BASOS ABS: 0 10*3/uL (ref 0.0–0.2)
Basos: 1 %
EOS (ABSOLUTE): 0.2 10*3/uL (ref 0.0–0.4)
Eos: 3 %
Hematocrit: 35.8 % (ref 34.0–46.6)
Hemoglobin: 12.2 g/dL (ref 11.1–15.9)
IMMATURE GRANS (ABS): 0 10*3/uL (ref 0.0–0.1)
IMMATURE GRANULOCYTES: 0 %
LYMPHS: 34 %
Lymphocytes Absolute: 2.6 10*3/uL (ref 0.7–3.1)
MCH: 29.3 pg (ref 26.6–33.0)
MCHC: 34.1 g/dL (ref 31.5–35.7)
MCV: 86 fL (ref 79–97)
Monocytes Absolute: 0.4 10*3/uL (ref 0.1–0.9)
Monocytes: 5 %
NEUTROS PCT: 57 %
Neutrophils Absolute: 4.5 10*3/uL (ref 1.4–7.0)
Platelets: 289 10*3/uL (ref 150–450)
RBC: 4.17 x10E6/uL (ref 3.77–5.28)
RDW: 12.7 % (ref 12.3–15.4)
WBC: 7.7 10*3/uL (ref 3.4–10.8)

## 2018-03-08 LAB — COMPREHENSIVE METABOLIC PANEL
ALT: 12 IU/L (ref 0–32)
AST: 17 IU/L (ref 0–40)
Albumin/Globulin Ratio: 1.8 (ref 1.2–2.2)
Albumin: 4.1 g/dL (ref 3.5–5.5)
Alkaline Phosphatase: 73 IU/L (ref 39–117)
BUN/Creatinine Ratio: 17 (ref 9–23)
BUN: 14 mg/dL (ref 6–20)
CHLORIDE: 108 mmol/L — AB (ref 96–106)
CO2: 20 mmol/L (ref 20–29)
Calcium: 9.6 mg/dL (ref 8.7–10.2)
Creatinine, Ser: 0.81 mg/dL (ref 0.57–1.00)
GFR calc non Af Amer: 96 mL/min/{1.73_m2} (ref >59)
GFR, EST AFRICAN AMERICAN: 111 mL/min/{1.73_m2} (ref >59)
GLUCOSE: 88 mg/dL (ref 65–99)
Globulin, Total: 2.3 g/dL (ref 1.5–4.5)
Potassium: 4 mmol/L (ref 3.5–5.2)
Sodium: 142 mmol/L (ref 134–144)
TOTAL PROTEIN: 6.4 g/dL (ref 6.0–8.5)

## 2018-03-08 LAB — T4, FREE: Free T4: 0.84 ng/dL (ref 0.82–1.77)

## 2018-03-08 LAB — VITAMIN D 25 HYDROXY (VIT D DEFICIENCY, FRACTURES): Vit D, 25-Hydroxy: 9.3 ng/mL — ABNORMAL LOW (ref 30.0–100.0)

## 2018-03-08 LAB — TSH: TSH: 0.863 u[IU]/mL (ref 0.450–4.500)

## 2018-03-08 MED ORDER — VITAMIN D (ERGOCALCIFEROL) 1.25 MG (50000 UNIT) PO CAPS: 50000.0000 [IU] | ORAL_CAPSULE | ORAL | 0 refills | Status: DC

## 2018-03-08 MED ORDER — AMOXICILLIN 875 MG PO TABS: 875.0000 mg | ORAL_TABLET | Freq: Two times a day (BID) | ORAL | 0 refills | Status: DC

## 2018-03-08 NOTE — Telephone Encounter (Signed)
Ok to treat her for possible sinus infection as she and I discussed. Please send in amoxicillin 875 mg twice daily x 10 days. #20, no refills. Please confirm that she is not allergic. She can also take 1 or 2 Aleve as discussed for headache. If she is feeling congested, I recommend trying Mucinex DM.

## 2018-03-08 NOTE — Telephone Encounter (Signed)
Patient called and would like to know if she can have something sent to the pharmacy because she is feeling worse today after trying the recommended nasal spray. Please advise.

## 2018-03-11 ENCOUNTER — Other Ambulatory Visit: Payer: Self-pay | Admitting: Family Medicine

## 2018-03-11 DIAGNOSIS — F329 Major depressive disorder, single episode, unspecified: Secondary | ICD-10-CM

## 2018-03-11 DIAGNOSIS — F419 Anxiety disorder, unspecified: Principal | ICD-10-CM

## 2018-03-25 ENCOUNTER — Telehealth: Payer: Self-pay | Admitting: Family Medicine

## 2018-03-25 NOTE — Telephone Encounter (Signed)
Dismissal letter in guarantor snapshot  °

## 2018-04-03 ENCOUNTER — Other Ambulatory Visit: Payer: Self-pay

## 2018-04-03 ENCOUNTER — Emergency Department (HOSPITAL_BASED_OUTPATIENT_CLINIC_OR_DEPARTMENT_OTHER)
Admission: EM | Admit: 2018-04-03 | Discharge: 2018-04-03 | Disposition: A | Discharge status: Home / Self Care | Payer: BLUE CROSS/BLUE SHIELD | Attending: Emergency Medicine | Admitting: Emergency Medicine

## 2018-04-03 ENCOUNTER — Encounter (HOSPITAL_BASED_OUTPATIENT_CLINIC_OR_DEPARTMENT_OTHER): Payer: Self-pay | Admitting: Emergency Medicine

## 2018-04-03 DIAGNOSIS — R519 Headache, unspecified: Secondary | ICD-10-CM

## 2018-04-03 DIAGNOSIS — J3489 Other specified disorders of nose and nasal sinuses: Secondary | ICD-10-CM | POA: Diagnosis not present

## 2018-04-03 DIAGNOSIS — R51 Headache: Secondary | ICD-10-CM | POA: Insufficient documentation

## 2018-04-03 DIAGNOSIS — Z79899 Other long term (current) drug therapy: Secondary | ICD-10-CM | POA: Insufficient documentation

## 2018-04-03 DIAGNOSIS — Z87891 Personal history of nicotine dependence: Secondary | ICD-10-CM | POA: Insufficient documentation

## 2018-04-03 DIAGNOSIS — R110 Nausea: Secondary | ICD-10-CM

## 2018-04-03 MED ORDER — FLUTICASONE PROPIONATE 50 MCG/ACT NA SUSP: 1.0000 | Freq: Every day | NASAL | 0 refills | Status: DC

## 2018-04-03 MED ORDER — CETIRIZINE HCL 10 MG PO TABS: 10.0000 mg | ORAL_TABLET | Freq: Every day | ORAL | 0 refills | Status: DC

## 2018-04-03 NOTE — ED Triage Notes (Signed)
Sinus pressure, facial pain, ear pain, and pain in teeth for a couple days.  Sts she was given abx for a sinus infection but she stopped taking it before it was finished. Sx are worse now.

## 2018-04-03 NOTE — ED Provider Notes (Signed)
[facility] EMERGENCY DEPARTMENT Provider Note   CSN: [id number] Arrival date & time: 04/03/18  [id number]     History   Chief Complaint Chief Complaint  Patient presents with  . sinus pressure    HPI Jodi Cochran is a 33 y.o. female.  33yo F w/ PMH including anxiety, GERD, IBS who p/w nausea, headaches, sinus pressure. Last month she was given abx for sinus infection, took them for a few days then stopped because they were making her constipated and feel bad. She actually felt better but has been having intermittent headaches and nausea. Every morning she wakes up feeling nauseated, once she gets moving she feels better. 2 days ago, she began having facial pains on cheeks shooting up towards ears. Pain lasted ~30 min then went away. Then her teeth began aching and this pain lasted for the rest of the day. She restarted antibiotics and took 2 doses. She denies fevers, cough or sore throat. LMP 1/2. No vomiting or diarrhea.   The history is provided by the patient.    Past Medical History:  Diagnosis Date  . Anxiety   . GERD (gastroesophageal reflux disease)   . HPV in female   . IBS (irritable bowel syndrome)   . Vitamin D deficiency 03/08/2018    Patient Active Problem List   Diagnosis Date Noted  . Vitamin D deficiency 03/08/2018  . Family history of diabetes mellitus (DM) 03/07/2018  . HPV in female   . ESOPHAGEAL REFLUX 04/26/2009  . HEMORRHAGE OF RECTUM AND ANUS 04/26/2009  . ABDOMINAL PAIN, EPIGASTRIC 04/26/2009    Past Surgical History:  Procedure Laterality Date  . NO PAST SURGERIES       OB History    Gravida  2   Para  1   Term      Preterm  1   AB      Living  1     SAB      TAB      Ectopic      Multiple      Live Births  1            Home Medications    Prior to Admission medications   Medication Sig Start Date End Date Taking? Authorizing Provider  amoxicillin (AMOXIL) 875 MG tablet Take 1 tablet (875 mg total) by  mouth 2 (two) times daily. 03/08/18   Henson, [name] L, NP-C  cetirizine (ZYRTEC) 10 MG tablet Take 1 tablet (10 mg total) by mouth daily. 04/03/18   Nazir Hacker, Ambrose Finlandachel Morgan, MD  citalopram (CELEXA) 20 MG tablet TAKE 1 TABLET BY MOUTH ONCE DAILY 03/11/18   Henson, [name] L, NP-C  dicyclomine (BENTYL) 20 MG tablet Take 1 tablet (20 mg total) by mouth 2 (two) times daily. Patient not taking: Reported on 03/07/2018 10/04/17   Renne CriglerGeiple, Joshua, PA-C  fluticasone [facility](FLONASE) 50 MCG/ACT nasal spray Place 1 spray into both nostrils daily. 04/03/18   Breton Berns, Ambrose Finlandachel Morgan, MD  Ibuprofen (ADVIL PO) Take by mouth.    [provider]  Multiple Vitamins-Minerals (MULTIVITAMIN GUMMIES ADULT PO) Take by mouth.    [provider]  ondansetron (ZOFRAN ODT) 4 MG disintegrating tablet Take 1 tablet (4 mg total) by mouth every 8 (eight) hours as needed for nausea or vomiting. Patient not taking: Reported on 03/07/2018 10/04/17   Renne CriglerGeiple, Joshua, PA-C  Vitamin D, Ergocalciferol, (DRISDOL) 1.25 MG (50000 UT) CAPS capsule Take 1 capsule (50,000 Units total) by mouth every 7 (seven) days. 03/08/18  Avanell ShackletonHenson, [name] L, NP-C    Family History Family History  Problem Relation Age of Onset  . Hypertension Mother   . Depression Mother   . Hypertension Maternal Grandmother   . Diabetes Maternal Grandmother   . Glaucoma Maternal Grandmother   . Thyroid disease Maternal Grandmother   . Depression Maternal Grandmother   . Glaucoma Paternal Grandmother   . Osteoarthritis Paternal Grandmother   . Bone cancer Paternal Grandmother     Social History Social History   Tobacco Use  . Smoking status: Former Smoker    Packs/day: 0.25    Types: Cigarettes    Last attempt to quit: 07/01/2017    Years since quitting: 0.7  . Smokeless tobacco: Never Used  . Tobacco comment: provided information regarding [place] Quit line  Substance Use Topics  . Alcohol use: Yes    Comment: last used this past week, was drinking  4  glasses of wine per night, heavy drinking on weekends .  Marland Kitchen. Drug use: Not Currently     Allergies   Promethazine   Review of Systems Review of Systems All other systems reviewed and are negative except that which was mentioned in HPI   Physical Exam Updated Vital Signs BP 126/75 (BP Location: Right Arm)   Pulse 71   Temp 98.6 F (37 C) (Oral)   Resp 18   Ht 5\' 11"  (1.803 m)   Wt 72.6 kg   LMP 03/14/2018   SpO2 100%   BMI 22.32 kg/m   Physical Exam Vitals signs and nursing note reviewed.  Constitutional:      General: She is not in acute distress.    Appearance: She is well-developed.  HENT:     Head: Normocephalic and atraumatic.     Comments: No maxillary sinus tenderness    Right Ear: Tympanic membrane and ear canal normal.     Left Ear: Tympanic membrane and ear canal normal.     Nose: No congestion or rhinorrhea.     Mouth/Throat:     Mouth: Mucous membranes are moist.     Pharynx: Oropharynx is clear.  Eyes:     Conjunctiva/sclera: Conjunctivae normal.  Neck:     Musculoskeletal: Neck supple.  Cardiovascular:     Rate and Rhythm: Normal rate and regular rhythm.     Heart sounds: Normal heart sounds. No murmur.  Pulmonary:     Effort: Pulmonary effort is normal.     Breath sounds: Normal breath sounds.  Abdominal:     General: Bowel sounds are normal. There is no distension.     Palpations: Abdomen is soft.     Tenderness: There is no abdominal tenderness.  Skin:    General: Skin is warm and dry.  Neurological:     General: No focal deficit present.     Mental Status: She is alert and oriented to person, place, and time.     Sensory: No sensory deficit.     Motor: No weakness.     Coordination: Coordination normal.     Comments: Fluent speech  Psychiatric:        Judgment: Judgment normal.      ED Treatments / Results  Labs (all labs ordered are listed, but only abnormal results are displayed) Labs Reviewed - No data to  display  EKG None  Radiology No results found.  Procedures Procedures (including critical care time)  Medications Ordered in ED Medications - No data to display   Initial Impression / Assessment and  Plan / ED Course  I have reviewed the triage vital signs and the nursing notes.     Her symptoms do not strongly suggest sinus infection therefore did not recommend antibiotics at this time.  No red flag symptoms for headaches to suggest acute intracranial process.  Discussed supportive measures for headaches.  She did note that she has been taking daily NSAIDs for the past several weeks, I discussed the possibility of rebound headaches and need to switch off of NSAIDs and take Tylenol instead.  Advised to follow-up with PCP if symptoms do not improve.  Will start on Flonase and Zyrtec daily.  Return precautions reviewed.  She voiced understanding.  Final Clinical Impressions(s) / ED Diagnoses   Final diagnoses:  Sinus pressure  Intermittent headache  Nausea    ED Discharge Orders         Ordered    fluticasone (FLONASE) 50 MCG/ACT nasal spray  Daily     04/03/18 0833    cetirizine (ZYRTEC) 10 MG tablet  Daily     04/03/18 0833           Adara Kittle, Ambrose Finland, MD 04/03/18 [id number]

## 2018-04-15 ENCOUNTER — Other Ambulatory Visit: Payer: Self-pay | Admitting: Family Medicine

## 2018-04-15 ENCOUNTER — Other Ambulatory Visit: Payer: Self-pay | Admitting: Internal Medicine

## 2018-04-15 DIAGNOSIS — F419 Anxiety disorder, unspecified: Principal | ICD-10-CM

## 2018-04-15 DIAGNOSIS — F32A Depression, unspecified: Secondary | ICD-10-CM

## 2018-04-15 DIAGNOSIS — F329 Major depressive disorder, single episode, unspecified: Secondary | ICD-10-CM

## 2018-04-15 MED ORDER — CITALOPRAM HYDROBROMIDE 20 MG PO TABS: 20.0000 mg | ORAL_TABLET | Freq: Every day | ORAL | 0 refills | Status: DC

## 2018-05-12 ENCOUNTER — Other Ambulatory Visit: Payer: Self-pay | Admitting: Family Medicine

## 2018-05-12 DIAGNOSIS — F419 Anxiety disorder, unspecified: Principal | ICD-10-CM

## 2018-05-12 DIAGNOSIS — F329 Major depressive disorder, single episode, unspecified: Secondary | ICD-10-CM

## 2018-05-13 ENCOUNTER — Other Ambulatory Visit: Payer: Self-pay | Admitting: Family Medicine

## 2018-05-13 DIAGNOSIS — F419 Anxiety disorder, unspecified: Principal | ICD-10-CM

## 2018-05-13 DIAGNOSIS — F329 Major depressive disorder, single episode, unspecified: Secondary | ICD-10-CM

## 2018-05-13 DIAGNOSIS — F32A Depression, unspecified: Secondary | ICD-10-CM

## 2018-05-21 ENCOUNTER — Other Ambulatory Visit: Payer: Self-pay

## 2018-05-21 ENCOUNTER — Encounter (HOSPITAL_BASED_OUTPATIENT_CLINIC_OR_DEPARTMENT_OTHER): Payer: Self-pay | Admitting: *Deleted

## 2018-05-21 ENCOUNTER — Emergency Department (HOSPITAL_BASED_OUTPATIENT_CLINIC_OR_DEPARTMENT_OTHER)
Admission: EM | Admit: 2018-05-21 | Discharge: 2018-05-21 | Disposition: A | Discharge status: Home / Self Care | Payer: BLUE CROSS/BLUE SHIELD | Attending: Emergency Medicine | Admitting: Emergency Medicine

## 2018-05-21 DIAGNOSIS — M791 Myalgia, unspecified site: Secondary | ICD-10-CM | POA: Insufficient documentation

## 2018-05-21 DIAGNOSIS — R509 Fever, unspecified: Secondary | ICD-10-CM | POA: Diagnosis not present

## 2018-05-21 DIAGNOSIS — Z79899 Other long term (current) drug therapy: Secondary | ICD-10-CM | POA: Insufficient documentation

## 2018-05-21 DIAGNOSIS — F329 Major depressive disorder, single episode, unspecified: Secondary | ICD-10-CM

## 2018-05-21 DIAGNOSIS — J020 Streptococcal pharyngitis: Secondary | ICD-10-CM | POA: Diagnosis not present

## 2018-05-21 DIAGNOSIS — R05 Cough: Secondary | ICD-10-CM | POA: Diagnosis not present

## 2018-05-21 DIAGNOSIS — Z87891 Personal history of nicotine dependence: Secondary | ICD-10-CM | POA: Diagnosis not present

## 2018-05-21 DIAGNOSIS — F419 Anxiety disorder, unspecified: Secondary | ICD-10-CM

## 2018-05-21 DIAGNOSIS — R070 Pain in throat: Secondary | ICD-10-CM | POA: Diagnosis present

## 2018-05-21 DIAGNOSIS — F32A Depression, unspecified: Secondary | ICD-10-CM

## 2018-05-21 LAB — GROUP A STREP BY PCR: GROUP A STREP BY PCR: DETECTED — AB

## 2018-05-21 MED ORDER — PENICILLIN G BENZATHINE 1200000 UNIT/2ML IM SUSP
1.2000 10*6.[IU] | Freq: Once | INTRAMUSCULAR | Status: AC
  Administered 2018-05-21: 1.2 10*6.[IU] via INTRAMUSCULAR
  Filled 2018-05-21: qty 2

## 2018-05-21 MED ORDER — IBUPROFEN 400 MG PO TABS
400.0000 mg | ORAL_TABLET | Freq: Once | ORAL | Status: AC
  Administered 2018-05-21: 400 mg via ORAL
  Filled 2018-05-21: qty 1

## 2018-05-21 MED ORDER — ACETAMINOPHEN 325 MG PO TABS
650.0000 mg | ORAL_TABLET | Freq: Once | ORAL | Status: AC
  Administered 2018-05-21: 650 mg via ORAL
  Filled 2018-05-21: qty 2

## 2018-05-21 NOTE — ED Notes (Signed)
Pt vomited at triage x 1 prior to medication.

## 2018-05-21 NOTE — Discharge Instructions (Addendum)
You were seen in the emergency department and diagnosed with strep throat.  You were given a shot of Penicillin - Penicillin is an antibiotic used to treat the infection   You should gradually feel better over the next few days. Take Tylenol and Ibuprofen for fever and pain. Follow up with your primary care provider in the next 1 week if you are not feeling better, if you do not have a primary care provider one is provided in your discharge instructions. Return to the emergency department for any new or worsening symptoms including but not limited to inability to open your mouth, inability to move your neck, worsening pain, change in your voice, inability to swallow your own saliva, drooling, or any other concerns.   

## 2018-05-21 NOTE — ED Provider Notes (Signed)
[facility] EMERGENCY DEPARTMENT Provider Note   CSN: [id number] Arrival date & time: 05/21/18  1059    History   Chief Complaint Chief Complaint  Patient presents with  . Sore Throat  . Generalized Body Aches    HPI Jodi Cochran is a 33 y.o. female with history of anxiety, GERD presenting to emergency department today with chief complaint of sore throat and generalized body aches x 2 days.  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  Her sore throat is a new problem. The problem occurs constantly. The problem has been gradually worsening.  She describes the pain as her throat feels raw.  The pain does not radiate The symptoms are aggravated by swallowing and eating.She has tried nothing for the symptoms. Patient reports associated fever, nonproductive cough.    Pertinent negatives include no chest pain, no abdominal pain, no headaches,no shortness of breath, no nausea, no urinary symptoms, no diarrhea, no neck pain. History  provided by patient.  Past Medical History:  Diagnosis Date  . Anxiety   . GERD (gastroesophageal reflux disease)   . HPV in female   . IBS (irritable bowel syndrome)   . Vitamin D deficiency 03/08/2018    Patient Active Problem List   Diagnosis Date Noted  . Vitamin D deficiency 03/08/2018  . Family history of diabetes mellitus (DM) 03/07/2018  . HPV in female   . ESOPHAGEAL REFLUX 04/26/2009  . HEMORRHAGE OF RECTUM AND ANUS 04/26/2009  . ABDOMINAL PAIN, EPIGASTRIC 04/26/2009    Past Surgical History:  Procedure Laterality Date  . NO PAST SURGERIES       OB History    Gravida  2   Para  1   Term      Preterm  1   AB      Living  1     SAB      TAB      Ectopic      Multiple      Live Births  1            Home Medications    Prior to Admission medications   Medication Sig Start Date End Date Taking? Authorizing Provider  citalopram (CELEXA) 20 MG tablet  Take 1 tablet (20 mg total) by mouth daily. 04/15/18  Yes Henson, [name] L, NP-C  amoxicillin (AMOXIL) 875 MG tablet Take 1 tablet (875 mg total) by mouth 2 (two) times daily. 03/08/18   Henson, [name] L, NP-C  cetirizine (ZYRTEC) 10 MG tablet Take 1 tablet (10 mg total) by mouth daily. 04/03/18   Little, Ambrose Finland, MD  dicyclomine (BENTYL) 20 MG tablet Take 1 tablet (20 mg total) by mouth 2 (two) times daily. Patient not taking: Reported on 03/07/2018 10/04/17   Renne Crigler, PA-C  fluticasone [facility]) 50 MCG/ACT nasal spray Place 1 spray into both nostrils daily. 04/03/18   Little, Ambrose Finland, MD  Ibuprofen (ADVIL PO) Take by mouth.    [provider]  Multiple Vitamins-Minerals (MULTIVITAMIN GUMMIES ADULT PO) Take by mouth.    [provider]  ondansetron (ZOFRAN ODT) 4 MG disintegrating tablet Take 1 tablet (4 mg total) by mouth every 8 (eight) hours as needed for nausea or vomiting. Patient not taking: Reported on 03/07/2018 10/04/17   Renne Crigler, PA-C  Vitamin D, Ergocalciferol, (DRISDOL) 1.25 MG (50000 UT) CAPS capsule Take 1 capsule (50,000 Units total) by mouth every 7 (seven) days. 03/08/18  Avanell Shackleton, NP-C    Family History Family History  Problem Relation Age of Onset  . Hypertension Mother   . Depression Mother   . Hypertension Maternal Grandmother   . Diabetes Maternal Grandmother   . Glaucoma Maternal Grandmother   . Thyroid disease Maternal Grandmother   . Depression Maternal Grandmother   . Glaucoma Paternal Grandmother   . Osteoarthritis Paternal Grandmother   . Bone cancer Paternal Grandmother     Social History Social History   Tobacco Use  . Smoking status: Former Smoker    Packs/day: 0.25    Types: Cigarettes    Last attempt to quit: 07/01/2017    Years since quitting: 0.8  . Smokeless tobacco: Never Used  . Tobacco comment: provided information regarding [place] Quit line  Substance Use Topics  . Alcohol use: Yes    Comment:  last used this past week, was drinking  4 glasses of wine per night, heavy drinking on weekends .  Marland Kitchen Drug use: Not Currently     Allergies   Promethazine   Review of Systems Review of Systems  Constitutional: Positive for fever.  HENT: Positive for sore throat. Negative for congestion, ear pain and sinus pain.   Eyes: Negative for pain and redness.  Respiratory: Positive for cough. Negative for shortness of breath.   Cardiovascular: Negative for chest pain.  Gastrointestinal: Negative for abdominal pain, diarrhea, nausea and vomiting.  Genitourinary: Negative for dysuria and frequency.  Musculoskeletal: Positive for arthralgias. Negative for back pain, neck pain and neck stiffness.  Skin: Negative for rash and wound.  Neurological: Negative for light-headedness and headaches.     Physical Exam Updated Vital Signs BP 135/76   Pulse (!) 110   Temp (!) 101.5 F (38.6 C) (Oral)   Resp 20   Ht 5\' 8"  (1.727 m)   Wt 77.1 kg   LMP 04/30/2018   SpO2 99%   BMI 25.85 kg/m   Physical Exam Vitals signs and nursing note reviewed.  Constitutional:      General: She is not in acute distress.    Appearance: She is well-developed. She is not toxic-appearing.  HENT:     Head: Normocephalic and atraumatic.     Right Ear: Tympanic membrane normal. Tympanic membrane is not erythematous.     Left Ear: Tympanic membrane normal. Tympanic membrane is not erythematous.     Nose: No congestion.     Mouth/Throat:     Mouth: Mucous membranes are dry. No oral lesions.     Pharynx: Uvula midline. Posterior oropharyngeal erythema present. No oropharyngeal exudate or uvula swelling.  Eyes:     General: No scleral icterus.       Right eye: No discharge.        Left eye: No discharge.     Conjunctiva/sclera: Conjunctivae normal.  Neck:     Musculoskeletal: Normal range of motion.  Cardiovascular:     Rate and Rhythm: Normal rate and regular rhythm.     Pulses: Normal pulses.     Heart  sounds: Normal heart sounds.  Pulmonary:     Effort: Pulmonary effort is normal.     Breath sounds: Normal breath sounds.  Abdominal:     General: There is no distension.  Musculoskeletal: Normal range of motion.  Lymphadenopathy:     Cervical: No cervical adenopathy.  Skin:    General: Skin is warm and dry.     Findings: No rash.  Neurological:     Mental Status:  She is oriented to person, place, and time.     Comments: Fluent speech, no facial droop.  Psychiatric:        Behavior: Behavior normal.      ED Treatments / Results  Labs (all labs ordered are listed, but only abnormal results are displayed) Labs Reviewed  GROUP A STREP BY PCR - Abnormal; Notable for the following components:      Result Value   Group A Strep by PCR DETECTED (*)    All other components within normal limits    EKG None  Radiology No results found.  Procedures Procedures (including critical care time)  Medications Ordered in ED Medications  ibuprofen (ADVIL,MOTRIN) tablet 400 mg (400 mg Oral Given 05/21/18 1113)  acetaminophen (TYLENOL) tablet 650 mg (650 mg Oral Given 05/21/18 1113)  penicillin g benzathine (BICILLIN LA) 1200000 UNIT/2ML injection 1.2 Million Units (1.2 Million Units Intramuscular Given 05/21/18 1247)     Initial Impression / Assessment and Plan / ED Course  I have reviewed the triage vital signs and the nursing notes.  Pertinent labs & imaging results that were available during my care of the patient were reviewed by me and considered in my medical decision making (see chart for details).    33 y.o. female patient with 2 days of sore throat. Rapid strep positive. Pain is not out of proportion to exam. No asymmetry of the uvula, tonsils, or soft palate. No stridor, trismus, or "hot potato" voice. The patient appears non-toxic. NAD. Treated in the ED with PCN IM.  Patient tolerating p.o. fluids.  Presentation not concerning for PTA or Ludwig's angina, Uvulitis,  epiglottitis, peritonsillar abscess, or retropharyngeal abscess. Specific return precautions discussed. Patient successfully fluid challenged in the ED without difficulty swallowing.  Strict return precautions given. NAD. VSS.  Patient's fever improved with Tylenol and Motrin.  Recommended PCP follow up for re-evaluation.   This note was prepared with assistance of Conservation officer, historic buildings. Occasional wrong-word or sound-a-like substitutions may have occurred due to the inherent limitations of voice recognition software.   Final Clinical Impressions(s) / ED Diagnoses   Final diagnoses:  Strep pharyngitis    ED Discharge Orders    None       Sherene Sires, PA-C 05/21/18 1313    Terrilee Files, MD 05/21/18 Rickey Primus

## 2018-05-21 NOTE — ED Triage Notes (Signed)
Body aches and sore throat x 2 days. She has not had fever reducer today.

## 2018-05-24 ENCOUNTER — Ambulatory Visit: Payer: Self-pay | Admitting: Family Medicine

## 2018-05-31 ENCOUNTER — Telehealth: Payer: Self-pay

## 2018-05-31 NOTE — Telephone Encounter (Signed)
Spoke with patient today and alerted them in efforts to minimize the risk of exposure to our patients, visitors, and healthcare providers to COVID - 19 we are reschedule new patient and physicals until May. Patient stated she would call back and reschedule closer to may at this time. Patient's appointment has been canceled and will await return call to reschedule at this time.

## 2018-06-04 ENCOUNTER — Ambulatory Visit: Payer: No Typology Code available for payment source | Admitting: Family

## 2018-06-29 ENCOUNTER — Other Ambulatory Visit: Payer: Self-pay | Admitting: Family Medicine

## 2018-06-29 DIAGNOSIS — F419 Anxiety disorder, unspecified: Principal | ICD-10-CM

## 2018-06-29 DIAGNOSIS — F329 Major depressive disorder, single episode, unspecified: Secondary | ICD-10-CM

## 2018-08-13 ENCOUNTER — Other Ambulatory Visit: Payer: Self-pay | Admitting: Family Medicine

## 2018-08-13 DIAGNOSIS — F329 Major depressive disorder, single episode, unspecified: Secondary | ICD-10-CM

## 2018-08-13 DIAGNOSIS — F32A Depression, unspecified: Secondary | ICD-10-CM

## 2018-11-30 ENCOUNTER — Other Ambulatory Visit: Payer: Self-pay

## 2018-11-30 ENCOUNTER — Emergency Department (HOSPITAL_COMMUNITY)
Admission: EM | Admit: 2018-11-30 | Discharge: 2018-11-30 | Disposition: A | Discharge status: Home / Self Care | Payer: BLUE CROSS/BLUE SHIELD | Attending: Emergency Medicine | Admitting: Emergency Medicine

## 2018-11-30 ENCOUNTER — Emergency Department (HOSPITAL_COMMUNITY): Payer: BLUE CROSS/BLUE SHIELD

## 2018-11-30 ENCOUNTER — Encounter (HOSPITAL_COMMUNITY): Payer: Self-pay

## 2018-11-30 DIAGNOSIS — Z79899 Other long term (current) drug therapy: Secondary | ICD-10-CM | POA: Insufficient documentation

## 2018-11-30 DIAGNOSIS — Y929 Unspecified place or not applicable: Secondary | ICD-10-CM | POA: Insufficient documentation

## 2018-11-30 DIAGNOSIS — S42002A Fracture of unspecified part of left clavicle, initial encounter for closed fracture: Secondary | ICD-10-CM | POA: Diagnosis not present

## 2018-11-30 DIAGNOSIS — S098XXA Other specified injuries of head, initial encounter: Secondary | ICD-10-CM | POA: Diagnosis present

## 2018-11-30 DIAGNOSIS — Y999 Unspecified external cause status: Secondary | ICD-10-CM | POA: Diagnosis not present

## 2018-11-30 DIAGNOSIS — Y9389 Activity, other specified: Secondary | ICD-10-CM | POA: Diagnosis not present

## 2018-11-30 DIAGNOSIS — Z87891 Personal history of nicotine dependence: Secondary | ICD-10-CM | POA: Insufficient documentation

## 2018-11-30 DIAGNOSIS — W19XXXA Unspecified fall, initial encounter: Secondary | ICD-10-CM

## 2018-11-30 MED ORDER — HYDROCODONE-ACETAMINOPHEN 5-325 MG PO TABS: ORAL_TABLET | ORAL | 0 refills | Status: DC

## 2018-11-30 MED ORDER — MORPHINE SULFATE (PF) 2 MG/ML IV SOLN
2.0000 mg | INTRAVENOUS | Status: AC | PRN
  Administered 2018-11-30: 2 mg via INTRAVENOUS
  Filled 2018-11-30: qty 1

## 2018-11-30 MED ORDER — MORPHINE SULFATE (PF) 2 MG/ML IV SOLN
2.0000 mg | INTRAVENOUS | Status: DC | PRN
  Administered 2018-11-30: 2 mg via INTRAMUSCULAR
  Filled 2018-11-30: qty 1

## 2018-11-30 MED ORDER — METHOCARBAMOL 500 MG PO TABS: 1000.0000 mg | ORAL_TABLET | Freq: Four times a day (QID) | ORAL | 0 refills | Status: DC | PRN
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
## 2018-11-30 NOTE — Discharge Instructions (Signed)
Take the prescriptions as directed.  Wear the sling until you are seen in follow up. Apply moist heat or ice to the area(s) of discomfort, for 15 minutes at a time, several times per day for the next few days.  Do not fall asleep on a heating or ice pack.  Call the Orthopedic doctor on Monday to schedule a follow up appointment this week.  Return to the Emergency Department immediately if worsening.

## 2018-11-30 NOTE — ED Notes (Signed)
Pt. Documented in error see above note in chart. 
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
## 2018-12-01 ENCOUNTER — Emergency Department (HOSPITAL_COMMUNITY)
Admission: EM | Admit: 2018-12-01 | Discharge: 2018-12-01 | Discharge status: Absent without leave | Payer: BLUE CROSS/BLUE SHIELD

## 2018-12-09 ENCOUNTER — Other Ambulatory Visit (HOSPITAL_COMMUNITY)
Admission: RE | Admit: 2018-12-09 | Discharge: 2018-12-09 | Disposition: A | Discharge status: Home / Self Care | Payer: BLUE CROSS/BLUE SHIELD | Source: Ambulatory Visit | Attending: Orthopedic Surgery | Admitting: Orthopedic Surgery

## 2018-12-09 ENCOUNTER — Other Ambulatory Visit: Payer: Self-pay | Admitting: Orthopedic Surgery

## 2018-12-09 DIAGNOSIS — Z20828 Contact with and (suspected) exposure to other viral communicable diseases: Secondary | ICD-10-CM | POA: Diagnosis not present

## 2018-12-09 DIAGNOSIS — Z01812 Encounter for preprocedural laboratory examination: Secondary | ICD-10-CM | POA: Insufficient documentation

## 2018-12-10 LAB — NOVEL CORONAVIRUS, NAA (HOSP ORDER, SEND-OUT TO REF LAB; TAT 18-24 HRS): SARS-CoV-2, NAA: NOT DETECTED

## 2018-12-10 NOTE — Patient Instructions (Addendum)
DUE TO COVID-19 ONLY ONE VISITOR IS ALLOWED TO COME WITH YOU AND STAY IN THE WAITING ROOM ONLY DURING PRE OP AND PROCEDURE DAY OF SURGERY. THE 1 VISITOR MAY VISIT WITH YOU AFTER SURGERY IN YOUR PRIVATE ROOM DURING VISITING HOURS ONLY!   ONCE YOUR COVID TEST IS COMPLETED, PLEASE BEGIN THE QUARANTINE INSTRUCTIONS AS OUTLINED IN YOUR HANDOUT.                Jodi Cochran   Your procedure is scheduled on: 12/12/18   Report to [facility] Main  Entrance   Report to admitting at   9:00 AM     Call this number if you have problems the morning of surgery [phone]   . BRUSH YOUR TEETH MORNING OF SURGERY AND RINSE YOUR MOUTH OUT, NO CHEWING GUM CANDY OR MINTS.   Do not eat food After Midnight  . YOU MAY HAVE CLEAR LIQUIDS FROM MIDNIGHT UNTIL 7:30AM  . At 7:30AM Please finish the prescribed Pre-Surgery Gatorade drink.   Nothing by mouth after you finish the Gatorade drink !   Take these medicines the morning of surgery with A SIP OF WATER: Celexa, Prilosec                                 You may not have any metal on your body including hair pins and              piercings  Do not wear jewelry, make-up, lotions, powders or perfumes, deodorant             Do not wear nail polish on your fingernails.  Do not shave  48 hours prior to surgery.           Do not bring valuables to the hospital. [place].  Contacts, dentures or bridgework may not be worn into surgery.      Patients discharged the day of surgery will not be allowed to drive home . IF YOU ARE HAVING SURGERY AND GOING HOME THE SAME DAY, YOU MUST HAVE AN ADULT TO DRIVE YOU HOME AND BE WITH YOU FOR 24 HOURS. YOU MAY GO HOME BY TAXI OR UBER OR ORTHERWISE, BUT AN ADULT MUST ACCOMPANY YOU HOME AND STAY WITH YOU FOR 24 HOURS.  Name and phone number of your driver:  Special Instructions: N/A              Please read over the following fact sheets you were  given: _____________________________________________________________________             [facility] - Preparing for Surgery  Before surgery, you can play an important role.   Because skin is not sterile, your skin needs to be as free of germs as possible.   You can reduce the number of germs on your skin by washing with CHG (chlorahexidine gluconate) soap before surgery.   CHG is an antiseptic cleaner which kills germs and bonds with the skin to continue killing germs even after washing. Please DO NOT use if you have an allergy to CHG or antibacterial soaps.   If your skin becomes reddened/irritated stop using the CHG and inform your nurse when you arrive at Short Stay. Do not shave (including legs and underarms) for at least 48 hours prior to the first CHG shower.  Please  follow these instructions carefully:  1.  Shower with CHG Soap the night before surgery and the  morning of Surgery.  2.  If you choose to wash your hair, wash your hair first as usual with your  normal  shampoo.  3.  After you shampoo, rinse your hair and body thoroughly to remove the  shampoo.                                        4.  Use CHG as you would any other liquid soap.  You can apply chg directly  to the skin and wash                       Gently with a scrungie or clean washcloth.  5.  Apply the CHG Soap to your body ONLY FROM THE NECK DOWN.   Do not use on face/ open                           Wound or open sores. Avoid contact with eyes, ears mouth and genitals (private parts).                       Wash face,  Genitals (private parts) with your normal soap.             6.  Wash thoroughly, paying special attention to the area where your surgery  will be performed.  7.  Thoroughly rinse your body with warm water from the neck down.  8.  DO NOT shower/wash with your normal soap after using and rinsing off  the CHG Soap.                9.  Pat yourself dry with a clean towel.            10.  Wear clean  pajamas.            11.  Place clean sheets on your bed the night of your first shower and do not  sleep with pets. Day of Surgery : Do not apply any lotions/deodorants the morning of surgery.  Please wear clean clothes to the hospital/surgery center.  FAILURE TO FOLLOW THESE INSTRUCTIONS MAY RESULT IN THE CANCELLATION OF YOUR SURGERY PATIENT SIGNATURE_________________________________  NURSE SIGNATURE__________________________________  ________________________________________________________________________   Jodi Cochran  An incentive spirometer is a tool that can help keep your lungs clear and active. This tool measures how well you are filling your lungs with each breath. Taking long deep breaths may help reverse or decrease the chance of developing breathing (pulmonary) problems (especially infection) following:  A long period of time when you are unable to move or be active. BEFORE THE PROCEDURE   If the spirometer includes an indicator to show your best effort, your nurse or respiratory therapist will set it to a desired goal.  If possible, sit up straight or lean slightly forward. Try not to slouch.  Hold the incentive spirometer in an upright position. INSTRUCTIONS FOR USE  1. Sit on the edge of your bed if possible, or sit up as far as you can in bed or on a chair. 2. Hold the incentive spirometer in an upright position. 3. Breathe out normally. 4. Place the mouthpiece in your mouth and seal your lips tightly around it. 5. Breathe in  slowly and as deeply as possible, raising the piston or the ball toward the top of the column. 6. Hold your breath for 3-5 seconds or for as long as possible. Allow the piston or ball to fall to the bottom of the column. 7. Remove the mouthpiece from your mouth and breathe out normally. 8. Rest for a few seconds and repeat Steps 1 through 7 at least 10 times every 1-2 hours when you are awake. Take your time and take a few normal breaths  between deep breaths. 9. The spirometer may include an indicator to show your best effort. Use the indicator as a goal to work toward during each repetition. 10. After each set of 10 deep breaths, practice coughing to be sure your lungs are clear. If you have an incision (the cut made at the time of surgery), support your incision when coughing by placing a pillow or rolled up towels firmly against it. Once you are able to get out of bed, walk around indoors and cough well. You may stop using the incentive spirometer when instructed by your caregiver.  RISKS AND COMPLICATIONS  Take your time so you do not get dizzy or light-headed.  If you are in pain, you may need to take or ask for pain medication before doing incentive spirometry. It is harder to take a deep breath if you are having pain. AFTER USE  Rest and breathe slowly and easily.  It can be helpful to keep track of a log of your progress. Your caregiver can provide you with a simple table to help with this. If you are using the spirometer at home, follow these instructions: SEEK MEDICAL CARE IF:   You are having difficultly using the spirometer.  You have trouble using the spirometer as often as instructed.  Your pain medication is not giving enough relief while using the spirometer.  You develop fever of 100.5 F (38.1 C) or higher. SEEK IMMEDIATE MEDICAL CARE IF:   You cough up bloody sputum that had not been present before.  You develop fever of 102 F (38.9 C) or greater.  You develop worsening pain at or near the incision site. MAKE SURE YOU:   Understand these instructions.  Will watch your condition.  Will get help right away if you are not doing well or get worse. Document Released: 07/10/2006 Document Revised: 05/22/2011 Document Reviewed: 09/10/2006 [facility] Patient Information 2014 [place].   ________________________________________________________________________

## 2018-12-11 ENCOUNTER — Encounter (HOSPITAL_COMMUNITY)
Admission: RE | Admit: 2018-12-11 | Discharge: 2018-12-11 | Disposition: A | Discharge status: Home / Self Care | Payer: BLUE CROSS/BLUE SHIELD | Source: Ambulatory Visit | Attending: Orthopedic Surgery | Admitting: Orthopedic Surgery

## 2018-12-11 ENCOUNTER — Other Ambulatory Visit: Payer: Self-pay

## 2018-12-11 ENCOUNTER — Encounter (HOSPITAL_COMMUNITY): Payer: Self-pay

## 2018-12-11 ENCOUNTER — Other Ambulatory Visit: Payer: Self-pay | Admitting: Orthopedic Surgery

## 2018-12-11 DIAGNOSIS — Z01818 Encounter for other preprocedural examination: Secondary | ICD-10-CM | POA: Diagnosis not present

## 2018-12-11 DIAGNOSIS — X58XXXA Exposure to other specified factors, initial encounter: Secondary | ICD-10-CM | POA: Diagnosis not present

## 2018-12-11 DIAGNOSIS — S42035A Nondisplaced fracture of lateral end of left clavicle, initial encounter for closed fracture: Secondary | ICD-10-CM | POA: Insufficient documentation

## 2018-12-11 HISTORY — DX: Anemia, unspecified: D64.9

## 2018-12-11 LAB — SURGICAL PCR SCREEN
MRSA, PCR: NEGATIVE
Staphylococcus aureus: NEGATIVE

## 2018-12-11 LAB — CBC
HCT: 40.8 % (ref 36.0–46.0)
Hemoglobin: 12.9 g/dL (ref 12.0–15.0)
MCH: 28.3 pg (ref 26.0–34.0)
MCHC: 31.6 g/dL (ref 30.0–36.0)
MCV: 89.5 fL (ref 80.0–100.0)
Platelets: 370 10*3/uL (ref 150–400)
RBC: 4.56 MIL/uL (ref 3.87–5.11)
RDW: 13.1 % (ref 11.5–15.5)
WBC: 9.5 10*3/uL (ref 4.0–10.5)
nRBC: 0 % (ref 0.0–0.2)

## 2018-12-11 MED ORDER — ENSURE PRE-SURGERY PO LIQD
296.0000 mL | Freq: Once | ORAL | Status: DC
  Filled 2018-12-11: qty 296

## 2018-12-11 NOTE — Progress Notes (Signed)
PCP - Virginia Rochester PA Cardiologist - none  Chest x-ray - 11/30/18 EKG - no Stress Test - no ECHO - no Cardiac Cath -no  Sleep Study - no CPAP -   Fasting Blood Sugar -NA Checks Blood Sugar _____ times a day  Blood Thinner Instructions:NA Aspirin Instructions: Last Dose:  Anesthesia review:   Patient denies shortness of breath, fever, cough and chest pain at PAT appointment yes  Patient verbalized understanding of instructions that were given to them at the PAT appointment. Patient was also instructed that they will need to review over the PAT instructions again at home before surgery. yes

## 2018-12-11 NOTE — Anesthesia Preprocedure Evaluation (Addendum)
Anesthesia Evaluation  Patient identified by MRN, date of birth, ID band Patient awake    Reviewed: Allergy & Precautions, NPO status , Patient's Chart, lab work & pertinent test results  History of Anesthesia Complications Negative for: history of anesthetic complications  Airway Mallampati: II  TM Distance: >3 FB Neck ROM: Full    Dental  (+) Chipped,    Pulmonary Current Smoker and Patient abstained from smoking.,    Pulmonary exam normal        Cardiovascular negative cardio ROS Normal cardiovascular exam     Neuro/Psych Anxiety negative neurological ROS  negative psych ROS   GI/Hepatic Neg liver ROS, GERD  Controlled and Medicated,  Endo/Other  negative endocrine ROS  Renal/GU negative Renal ROS  negative genitourinary   Musculoskeletal negative musculoskeletal ROS (+)   Abdominal   Peds  Hematology negative hematology ROS (+)   Anesthesia Other Findings Day of surgery medications reviewed with patient.  Reproductive/Obstetrics negative OB ROS                            Anesthesia Physical Anesthesia Plan  ASA: II  Anesthesia Plan: General   Post-op Pain Management:    Induction: Intravenous  PONV Risk Score and Plan: 3 and Midazolam, Scopolamine patch - Pre-op, Ondansetron, Dexamethasone and Treatment may vary due to age or medical condition  Airway Management Planned: Oral ETT  Additional Equipment: None  Intra-op Plan:   Post-operative Plan: Extubation in OR  Informed Consent: I have reviewed the patients History and Physical, chart, labs and discussed the procedure including the risks, benefits and alternatives for the proposed anesthesia with the patient or authorized representative who has indicated his/her understanding and acceptance.     Dental advisory given  Plan Discussed with: CRNA  Anesthesia Plan Comments:       Anesthesia Quick Evaluation

## 2018-12-12 ENCOUNTER — Ambulatory Visit (HOSPITAL_COMMUNITY): Payer: BLUE CROSS/BLUE SHIELD

## 2018-12-12 ENCOUNTER — Encounter (HOSPITAL_COMMUNITY): Payer: Self-pay | Admitting: Anesthesiology

## 2018-12-12 ENCOUNTER — Encounter (HOSPITAL_COMMUNITY)
Admission: RE | Disposition: A | Discharge status: Home / Self Care | Payer: Self-pay | Source: Home / Self Care | Attending: Orthopedic Surgery

## 2018-12-12 ENCOUNTER — Ambulatory Visit (HOSPITAL_COMMUNITY): Payer: BLUE CROSS/BLUE SHIELD | Admitting: Physician Assistant

## 2018-12-12 ENCOUNTER — Ambulatory Visit (HOSPITAL_COMMUNITY)
Admission: RE | Admit: 2018-12-12 | Discharge: 2018-12-12 | Disposition: A | Discharge status: Home / Self Care | Payer: BLUE CROSS/BLUE SHIELD | Attending: Orthopedic Surgery | Admitting: Orthopedic Surgery

## 2018-12-12 DIAGNOSIS — S42002A Fracture of unspecified part of left clavicle, initial encounter for closed fracture: Secondary | ICD-10-CM | POA: Diagnosis not present

## 2018-12-12 DIAGNOSIS — K219 Gastro-esophageal reflux disease without esophagitis: Secondary | ICD-10-CM | POA: Diagnosis not present

## 2018-12-12 DIAGNOSIS — F419 Anxiety disorder, unspecified: Secondary | ICD-10-CM | POA: Insufficient documentation

## 2018-12-12 DIAGNOSIS — W052XXA Fall from non-moving motorized mobility scooter, initial encounter: Secondary | ICD-10-CM | POA: Insufficient documentation

## 2018-12-12 DIAGNOSIS — Z79899 Other long term (current) drug therapy: Secondary | ICD-10-CM | POA: Insufficient documentation

## 2018-12-12 DIAGNOSIS — Z419 Encounter for procedure for purposes other than remedying health state, unspecified: Secondary | ICD-10-CM

## 2018-12-12 HISTORY — PX: ORIF CLAVICULAR FRACTURE: SHX5055

## 2018-12-12 LAB — PREGNANCY, URINE: Preg Test, Ur: NEGATIVE

## 2018-12-12 SURGERY — OPEN REDUCTION INTERNAL FIXATION (ORIF) CLAVICULAR FRACTURE
Anesthesia: General | Site: Shoulder | Laterality: Left

## 2018-12-12 MED ORDER — DEXAMETHASONE SODIUM PHOSPHATE 4 MG/ML IJ SOLN
INTRAMUSCULAR | Status: DC | PRN
  Administered 2018-12-12: 10 mg via INTRAVENOUS

## 2018-12-12 MED ORDER — BUPIVACAINE-EPINEPHRINE 0.25% -1:200000 IJ SOLN
INTRAMUSCULAR | Status: DC | PRN
  Administered 2018-12-12: 10 mL

## 2018-12-12 MED ORDER — OXYCODONE HCL 5 MG PO TABS
5.0000 mg | ORAL_TABLET | Freq: Once | ORAL | Status: AC | PRN
  Administered 2018-12-12: 16:00:00 5 mg via ORAL

## 2018-12-12 MED ORDER — METHOCARBAMOL 500 MG PO TABS: 1000.0000 mg | ORAL_TABLET | Freq: Four times a day (QID) | ORAL | 0 refills | Status: DC | PRN

## 2018-12-12 MED ORDER — SODIUM CHLORIDE 0.9 % IR SOLN
Status: DC | PRN
  Administered 2018-12-12: 1000 mL

## 2018-12-12 MED ORDER — FENTANYL CITRATE (PF) 100 MCG/2ML IJ SOLN
INTRAMUSCULAR | Status: AC
  Administered 2018-12-12: 50 ug via INTRAVENOUS
  Filled 2018-12-12: qty 2

## 2018-12-12 MED ORDER — CEFAZOLIN SODIUM-DEXTROSE 2-4 GM/100ML-% IV SOLN
2.0000 g | INTRAVENOUS | Status: AC
  Administered 2018-12-12: 2 g via INTRAVENOUS
  Filled 2018-12-12: qty 100

## 2018-12-12 MED ORDER — EPHEDRINE SULFATE-NACL 50-0.9 MG/10ML-% IV SOSY
PREFILLED_SYRINGE | INTRAVENOUS | Status: DC | PRN
  Administered 2018-12-12: 15 mg via INTRAVENOUS
  Administered 2018-12-12: 10 mg via INTRAVENOUS

## 2018-12-12 MED ORDER — ONDANSETRON HCL 4 MG/2ML IJ SOLN
INTRAMUSCULAR | Status: AC
  Filled 2018-12-12: qty 2

## 2018-12-12 MED ORDER — LIP MEDEX EX OINT
TOPICAL_OINTMENT | CUTANEOUS | Status: AC
  Filled 2018-12-12: qty 7

## 2018-12-12 MED ORDER — LACTATED RINGERS IV SOLN
INTRAVENOUS | Status: DC
  Administered 2018-12-12: 10:00:00 via INTRAVENOUS

## 2018-12-12 MED ORDER — OXYCODONE-ACETAMINOPHEN 5-325 MG PO TABS: ORAL_TABLET | ORAL | 0 refills | Status: DC

## 2018-12-12 MED ORDER — ACETAMINOPHEN 500 MG PO TABS
1000.0000 mg | ORAL_TABLET | Freq: Once | ORAL | Status: AC
  Administered 2018-12-12: 10:00:00 1000 mg via ORAL
  Filled 2018-12-12: qty 2

## 2018-12-12 MED ORDER — LIDOCAINE 2% (20 MG/ML) 5 ML SYRINGE
INTRAMUSCULAR | Status: AC
  Filled 2018-12-12: qty 5

## 2018-12-12 MED ORDER — LIDOCAINE 2% (20 MG/ML) 5 ML SYRINGE
INTRAMUSCULAR | Status: DC | PRN
  Administered 2018-12-12 (×2): 40 mg via INTRAVENOUS

## 2018-12-12 MED ORDER — ROCURONIUM BROMIDE 100 MG/10ML IV SOLN
INTRAVENOUS | Status: DC | PRN
  Administered 2018-12-12: 60 mg via INTRAVENOUS

## 2018-12-12 MED ORDER — BUPIVACAINE-EPINEPHRINE 0.5% -1:200000 IJ SOLN
INTRAMUSCULAR | Status: AC
  Filled 2018-12-12: qty 1

## 2018-12-12 MED ORDER — SUGAMMADEX SODIUM 200 MG/2ML IV SOLN
INTRAVENOUS | Status: DC | PRN
  Administered 2018-12-12: 200 mg via INTRAVENOUS

## 2018-12-12 MED ORDER — HYDROMORPHONE HCL 1 MG/ML IJ SOLN
INTRAMUSCULAR | Status: AC
  Administered 2018-12-12: 0.5 mg via INTRAVENOUS
  Filled 2018-12-12: qty 1

## 2018-12-12 MED ORDER — MIDAZOLAM HCL 5 MG/5ML IJ SOLN
INTRAMUSCULAR | Status: DC | PRN
  Administered 2018-12-12: 2 mg via INTRAVENOUS

## 2018-12-12 MED ORDER — FENTANYL CITRATE (PF) 100 MCG/2ML IJ SOLN
INTRAMUSCULAR | Status: AC
  Filled 2018-12-12: qty 2

## 2018-12-12 MED ORDER — SCOPOLAMINE 1 MG/3DAYS TD PT72
1.0000 | MEDICATED_PATCH | Freq: Once | TRANSDERMAL | Status: DC
  Administered 2018-12-12: 1.5 mg via TRANSDERMAL
  Filled 2018-12-12: qty 1

## 2018-12-12 MED ORDER — MIDAZOLAM HCL 2 MG/2ML IJ SOLN
INTRAMUSCULAR | Status: AC
  Filled 2018-12-12: qty 2

## 2018-12-12 MED ORDER — DEXAMETHASONE SODIUM PHOSPHATE 10 MG/ML IJ SOLN
INTRAMUSCULAR | Status: AC
  Filled 2018-12-12: qty 1

## 2018-12-12 MED ORDER — PROMETHAZINE HCL 25 MG/ML IJ SOLN: 6.2500 mg | INTRAMUSCULAR | Status: DC | PRN

## 2018-12-12 MED ORDER — CHLORHEXIDINE GLUCONATE 4 % EX LIQD: 60.0000 mL | Freq: Once | CUTANEOUS | Status: DC

## 2018-12-12 MED ORDER — PROPOFOL 10 MG/ML IV BOLUS
INTRAVENOUS | Status: DC | PRN
  Administered 2018-12-12: 150 mg via INTRAVENOUS

## 2018-12-12 MED ORDER — OXYCODONE HCL 5 MG/5ML PO SOLN: 5.0000 mg | Freq: Once | ORAL | Status: AC | PRN

## 2018-12-12 MED ORDER — HYDROMORPHONE HCL 1 MG/ML IJ SOLN
0.2500 mg | INTRAMUSCULAR | Status: DC | PRN
  Administered 2018-12-12 (×2): 0.5 mg via INTRAVENOUS

## 2018-12-12 MED ORDER — ROCURONIUM BROMIDE 10 MG/ML (PF) SYRINGE
PREFILLED_SYRINGE | INTRAVENOUS | Status: AC
  Filled 2018-12-12: qty 10

## 2018-12-12 MED ORDER — PROPOFOL 10 MG/ML IV BOLUS
INTRAVENOUS | Status: AC
  Filled 2018-12-12: qty 20

## 2018-12-12 MED ORDER — FENTANYL CITRATE (PF) 100 MCG/2ML IJ SOLN
INTRAMUSCULAR | Status: DC | PRN
  Administered 2018-12-12: 100 ug via INTRAVENOUS
  Administered 2018-12-12: 50 ug via INTRAVENOUS
  Administered 2018-12-12: 100 ug via INTRAVENOUS

## 2018-12-12 MED ORDER — EPHEDRINE 5 MG/ML INJ
INTRAVENOUS | Status: AC
  Filled 2018-12-12: qty 10

## 2018-12-12 MED ORDER — FENTANYL CITRATE (PF) 100 MCG/2ML IJ SOLN
25.0000 ug | INTRAMUSCULAR | Status: DC | PRN
  Administered 2018-12-12 (×3): 50 ug via INTRAVENOUS

## 2018-12-12 MED ORDER — FENTANYL CITRATE (PF) 250 MCG/5ML IJ SOLN
INTRAMUSCULAR | Status: AC
  Filled 2018-12-12: qty 5

## 2018-12-12 MED ORDER — ONDANSETRON HCL 4 MG/2ML IJ SOLN
INTRAMUSCULAR | Status: DC | PRN
  Administered 2018-12-12: 4 mg via INTRAVENOUS

## 2018-12-12 MED ORDER — OXYCODONE HCL 5 MG PO TABS
ORAL_TABLET | ORAL | Status: AC
  Filled 2018-12-12: qty 1

## 2018-12-12 MED ORDER — POVIDONE-IODINE 10 % EX SWAB
2.0000 "application " | Freq: Once | CUTANEOUS | Status: AC
  Administered 2018-12-12: 2 via TOPICAL

## 2018-12-12 SURGICAL SUPPLY — 58 items
BIT DRILL 2.3 QUICK RELEASE (BIT) IMPLANT
BIT DRILL 2.8X5 QR DISP (BIT) ×2 IMPLANT
BIT DRILL QUICK RELEASE 2.0MM (INSTRUMENTS) IMPLANT
CLOSURE STERI-STRIP 1/2X4 (GAUZE/BANDAGES/DRESSINGS) ×1
CLOSURE WOUND 1/2 X4 (GAUZE/BANDAGES/DRESSINGS) ×1
CLSR STERI-STRIP ANTIMIC 1/2X4 (GAUZE/BANDAGES/DRESSINGS) ×1 IMPLANT
COVER SURGICAL LIGHT HANDLE (MISCELLANEOUS) ×3 IMPLANT
COVER WAND RF STERILE (DRAPES) IMPLANT
DRAPE INCISE IOBAN 66X45 STRL (DRAPES) ×3 IMPLANT
DRAPE U-SHAPE 47X51 STRL (DRAPES) ×3 IMPLANT
DRILL 2.3 QUICK RELEASE (BIT) ×3
DRILL QUICK RELEASE 2.0MM (INSTRUMENTS) ×3
DRSG AQUACEL AG ADV 3.5X 6 (GAUZE/BANDAGES/DRESSINGS) ×2 IMPLANT
DRSG EMULSION OIL 3X3 NADH (GAUZE/BANDAGES/DRESSINGS) ×6 IMPLANT
DRSG PAD ABDOMINAL 8X10 ST (GAUZE/BANDAGES/DRESSINGS) ×6 IMPLANT
DURAPREP 26ML APPLICATOR (WOUND CARE) ×3 IMPLANT
ELECT REM PT RETURN 15FT ADLT (MISCELLANEOUS) ×3 IMPLANT
GAUZE SPONGE 4X4 12PLY STRL (GAUZE/BANDAGES/DRESSINGS) ×3 IMPLANT
GLOVE BIO SURGEON STRL SZ7.5 (GLOVE) ×3 IMPLANT
GLOVE BIOGEL PI IND STRL 8 (GLOVE) ×1 IMPLANT
GLOVE BIOGEL PI INDICATOR 8 (GLOVE) ×2
GOWN STRL REUS W/ TWL XL LVL3 (GOWN DISPOSABLE) ×1 IMPLANT
GOWN STRL REUS W/TWL LRG LVL3 (GOWN DISPOSABLE) ×6 IMPLANT
GOWN STRL REUS W/TWL XL LVL3 (GOWN DISPOSABLE) ×3
KIT BASIN OR (CUSTOM PROCEDURE TRAY) ×3 IMPLANT
KIT TURNOVER KIT A (KITS) IMPLANT
MANIFOLD NEPTUNE II (INSTRUMENTS) ×3 IMPLANT
NDL HYPO 25X1 1.5 SAFETY (NEEDLE) ×1 IMPLANT
NDL SPNL 18GX3.5 QUINCKE PK (NEEDLE) ×1 IMPLANT
NEEDLE HYPO 25X1 1.5 SAFETY (NEEDLE) ×3 IMPLANT
NEEDLE SPNL 18GX3.5 QUINCKE PK (NEEDLE) ×3 IMPLANT
NS IRRIG 1000ML POUR BTL (IV SOLUTION) ×3 IMPLANT
PACK SHOULDER (CUSTOM PROCEDURE TRAY) ×3 IMPLANT
PLATE CLAVICLE LATERAL (Plate) ×2 IMPLANT
RETRIEVER SUT HEWSON (MISCELLANEOUS) ×3 IMPLANT
SCREW HEXALOBE NON-LOCK 3.5X16 (Screw) ×2 IMPLANT
SCREW LOCK 12X3.5X HEXALOBE (Screw) IMPLANT
SCREW LOCKING 3.5X12 (Screw) ×6 IMPLANT
SCREW NON LOCK 3.5X10MM (Screw) ×2 IMPLANT
SCREW NON TOGG 2.3X22MM (Screw) ×2 IMPLANT
SCREW NONLOCK HEX 3.5X12 (Screw) ×6 IMPLANT
SLING ARM FOAM STRAP LRG (SOFTGOODS) ×3 IMPLANT
SLING ARM FOAM STRAP MED (SOFTGOODS) ×2 IMPLANT
STRIP CLOSURE SKIN 1/2X4 (GAUZE/BANDAGES/DRESSINGS) ×2 IMPLANT
SUCTION FRAZIER HANDLE 10FR (MISCELLANEOUS) ×2
SUCTION TUBE FRAZIER 10FR DISP (MISCELLANEOUS) ×1 IMPLANT
SUT FIBERWIRE #2 38 T-5 BLUE (SUTURE)
SUT MNCRL 0 MO-4 VIOLET 18 CR (SUTURE) ×1 IMPLANT
SUT MNCRL AB 4-0 PS2 18 (SUTURE) ×2 IMPLANT
SUT MONOCRYL 0 MO 4 18  CR/8 (SUTURE) ×2
SUT PDS AB 1 CT1 27 (SUTURE) ×3 IMPLANT
SUT VIC AB 0 CT1 36 (SUTURE) ×2 IMPLANT
SUT VIC AB 2-0 CT1 27 (SUTURE) ×3
SUT VIC AB 2-0 CT1 TAPERPNT 27 (SUTURE) IMPLANT
SUTURE FIBERWR #2 38 T-5 BLUE (SUTURE) IMPLANT
SYR CONTROL 10ML LL (SYRINGE) ×3 IMPLANT
TOWEL OR 17X26 10 PK STRL BLUE (TOWEL DISPOSABLE) ×6 IMPLANT
WATER STERILE IRR 1000ML POUR (IV SOLUTION) ×3 IMPLANT
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
## 2018-12-12 NOTE — Discharge Instructions (Signed)
Discharge Instructions after Open Shoulder Repair  A sling has been provided for you. Remain in your sling at all times. This includes sleeping in your sling.  Use ice on the shoulder intermittently over the first 48 hours after surgery.  Pain medicine has been prescribed for you.  Use your medicine liberally over the first 48 hours, and then you can begin to taper your use. You may take Extra Strength Tylenol or Tylenol only in place of the pain pills. DO NOT take ANY nonsteroidal anti-inflammatory pain medications: Advil, Motrin, Ibuprofen, Aleve, Naproxen or Naprosyn.  You may remove your dressing after two days  You may shower after surgery. As long as the dressing is intact, it is waterproof. Please leave it on until your first post op visit with Dr. Ave Filter.  Take one aspirin, a day for 2 weeks after surgery, unless you have an aspirin sensitivity/ allergy or asthma.   Please call [phone] during normal business hours or [phone] after hours for any problems. Including the following:  - excessive redness of the incisions - drainage for more than 4 days - fever of more than 101.5 F  *Please note that pain medications will not be refilled after hours or on weekends.   General Anesthesia, Adult, Care After This sheet gives you information about how to care for yourself after your procedure. Your health care provider may also give you more specific instructions. If you have problems or questions, contact your health care provider. What can I expect after the procedure? After the procedure, the following side effects are common:  Pain or discomfort at the IV site.  Nausea.  Vomiting.  Sore throat.  Trouble concentrating.  Feeling cold or chills.  Weak or tired.  Sleepiness and fatigue.  Soreness and body aches. These side effects can affect parts of the body that were not involved in surgery. Follow these instructions at home:  For at least 24 hours after the  procedure:  Have a responsible adult stay with you. It is important to have someone help care for you until you are awake and alert.  Rest as needed.  Do not: ? Participate in activities in which you could fall or become injured. ? Drive. ? Use heavy machinery. ? Drink alcohol. ? Take sleeping pills or medicines that cause drowsiness. ? Make important decisions or sign legal documents. ? Take care of children on your own. Eating and drinking  Follow any instructions from your health care provider about eating or drinking restrictions.  When you feel hungry, start by eating small amounts of foods that are soft and easy to digest (bland), such as toast. Gradually return to your regular diet.  Drink enough fluid to keep your urine pale yellow.  If you vomit, rehydrate by drinking water, juice, or clear broth. General instructions  If you have sleep apnea, surgery and certain medicines can increase your risk for breathing problems. Follow instructions from your health care provider about wearing your sleep device: ? Anytime you are sleeping, including during daytime naps. ? While taking prescription pain medicines, sleeping medicines, or medicines that make you drowsy.  Return to your normal activities as told by your health care provider. Ask your health care provider what activities are safe for you.  Take over-the-counter and prescription medicines only as told by your health care provider.  If you smoke, do not smoke without supervision.  Keep all follow-up visits as told by your health care provider. This is important. Contact a health  care provider if:  You have nausea or vomiting that does not get better with medicine.  You cannot eat or drink without vomiting.  You have pain that does not get better with medicine.  You are unable to pass urine.  You develop a skin rash.  You have a fever.  You have redness around your IV site that gets worse. Get help right away  if:  You have difficulty breathing.  You have chest pain.  You have blood in your urine or stool, or you vomit blood. Summary  After the procedure, it is common to have a sore throat or nausea. It is also common to feel tired.  Have a responsible adult stay with you for the first 24 hours after general anesthesia. It is important to have someone help care for you until you are awake and alert.  When you feel hungry, start by eating small amounts of foods that are soft and easy to digest (bland), such as toast. Gradually return to your regular diet.  Drink enough fluid to keep your urine pale yellow.  Return to your normal activities as told by your health care provider. Ask your health care provider what activities are safe for you. This information is not intended to replace advice given to you by your health care provider. Make sure you discuss any questions you have with your health care provider. Document Released: 06/05/2000 Document Revised: 03/02/2017 Document Reviewed: 10/13/2016 Elsevier Patient Education  2020 [employer].

## 2018-12-12 NOTE — Anesthesia Postprocedure Evaluation (Signed)
Anesthesia Post Note  Patient: Jodi Cochran  Procedure(s) Performed: OPEN REDUCTION INTERNAL FIXATION (ORIF) CLAVICULAR FRACTURE (Left Shoulder)     Patient location during evaluation: PACU Anesthesia Type: General Level of consciousness: awake and alert and oriented Pain management: pain level controlled Vital Signs Assessment: post-procedure vital signs reviewed and stable Respiratory status: spontaneous breathing, nonlabored ventilation and respiratory function stable Cardiovascular status: blood pressure returned to baseline Postop Assessment: no apparent nausea or vomiting Anesthetic complications: no    Last Vitals:  Vitals:   12/12/18 1400 12/12/18 1415  BP: (!) 145/91 138/86  Pulse: 72 69  Resp: 15 14  Temp:    SpO2: 98% 96%    Last Pain:  Vitals:   12/12/18 1400  TempSrc:   PainSc: [place]
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
## 2018-12-12 NOTE — Anesthesia Procedure Notes (Signed)
Procedure Name: Intubation Date/Time: 12/12/2018 11:09 AM Performed by: Lieutenant Diego, CRNA Pre-anesthesia Checklist: Patient identified, Emergency Drugs available, Suction available and Patient being monitored Patient Re-evaluated:Patient Re-evaluated prior to induction Oxygen Delivery Method: Circle system utilized Preoxygenation: Pre-oxygenation with 100% oxygen Induction Type: IV induction Ventilation: Mask ventilation without difficulty Laryngoscope Size: Miller and 2 Grade View: Grade I Tube type: Oral Tube size: 7.0 mm Number of attempts: 1 Airway Equipment and Method: Stylet Placement Confirmation: ETT inserted through vocal cords under direct vision,  positive ETCO2 and breath sounds checked- equal and bilateral Secured at: 22 cm Tube secured with: Tape Dental Injury: Teeth and Oropharynx as per pre-operative assessment

## 2018-12-12 NOTE — Transfer of Care (Signed)
Immediate Anesthesia Transfer of Care Note  Patient: Jodi Cochran  Procedure(s) Performed: OPEN REDUCTION INTERNAL FIXATION (ORIF) CLAVICULAR FRACTURE (Left Shoulder)  Patient Location: PACU  Anesthesia Type:General  Level of Consciousness: awake  Airway & Oxygen Therapy: Patient Spontanous Breathing and Patient connected to face mask oxygen  Post-op Assessment: Report given to RN and Post -op Vital signs reviewed and stable  Post vital signs: Reviewed and stable  Last Vitals:  Vitals Value Taken Time  BP    Temp    Pulse 94 12/12/18 1239  Resp 17 12/12/18 1239  SpO2 100 % 12/12/18 1239  Vitals shown include unvalidated device data.  Last Pain:  Vitals:   12/12/18 0910  TempSrc: Oral         Complications: No apparent anesthesia complications

## 2018-12-20 ENCOUNTER — Encounter (HOSPITAL_COMMUNITY): Payer: Self-pay | Admitting: Orthopedic Surgery

## 2019-06-14 ENCOUNTER — Ambulatory Visit: Payer: Self-pay

## 2019-06-26 ENCOUNTER — Ambulatory Visit: Payer: Medicaid Other | Attending: Internal Medicine

## 2019-07-07 ENCOUNTER — Other Ambulatory Visit: Payer: Self-pay

## 2019-07-07 ENCOUNTER — Encounter (HOSPITAL_COMMUNITY): Payer: Self-pay

## 2019-07-07 ENCOUNTER — Emergency Department (HOSPITAL_COMMUNITY)
Admission: EM | Admit: 2019-07-07 | Discharge: 2019-07-08 | Disposition: A | Discharge status: Other Acute Inpatient Hospital | Payer: Medicaid Other | Attending: Emergency Medicine | Admitting: Emergency Medicine

## 2019-07-07 DIAGNOSIS — F329 Major depressive disorder, single episode, unspecified: Secondary | ICD-10-CM | POA: Insufficient documentation

## 2019-07-07 DIAGNOSIS — F17210 Nicotine dependence, cigarettes, uncomplicated: Secondary | ICD-10-CM | POA: Diagnosis not present

## 2019-07-07 DIAGNOSIS — T50902A Poisoning by unspecified drugs, medicaments and biological substances, intentional self-harm, initial encounter: Secondary | ICD-10-CM

## 2019-07-07 DIAGNOSIS — F419 Anxiety disorder, unspecified: Secondary | ICD-10-CM | POA: Diagnosis not present

## 2019-07-07 DIAGNOSIS — S61219A Laceration without foreign body of unspecified finger without damage to nail, initial encounter: Secondary | ICD-10-CM

## 2019-07-07 DIAGNOSIS — F332 Major depressive disorder, recurrent severe without psychotic features: Secondary | ICD-10-CM | POA: Diagnosis present

## 2019-07-07 DIAGNOSIS — Z20822 Contact with and (suspected) exposure to covid-19: Secondary | ICD-10-CM | POA: Diagnosis not present

## 2019-07-07 DIAGNOSIS — T424X2A Poisoning by benzodiazepines, intentional self-harm, initial encounter: Secondary | ICD-10-CM | POA: Insufficient documentation

## 2019-07-07 DIAGNOSIS — Z79899 Other long term (current) drug therapy: Secondary | ICD-10-CM | POA: Insufficient documentation

## 2019-07-07 DIAGNOSIS — T1491XA Suicide attempt, initial encounter: Secondary | ICD-10-CM | POA: Diagnosis present

## 2019-07-07 DIAGNOSIS — T50904A Poisoning by unspecified drugs, medicaments and biological substances, undetermined, initial encounter: Secondary | ICD-10-CM | POA: Diagnosis present

## 2019-07-07 HISTORY — DX: Depression, unspecified: F32.A

## 2019-07-07 LAB — CBC WITH DIFFERENTIAL/PLATELET
Abs Immature Granulocytes: 0.01 10*3/uL (ref 0.00–0.07)
Basophils Absolute: 0 10*3/uL (ref 0.0–0.1)
Basophils Relative: 1 %
Eosinophils Absolute: 0.2 10*3/uL (ref 0.0–0.5)
Eosinophils Relative: 2 %
HCT: 42.8 % (ref 36.0–46.0)
Hemoglobin: 13.7 g/dL (ref 12.0–15.0)
Immature Granulocytes: 0 %
Lymphocytes Relative: 31 %
Lymphs Abs: 2.3 10*3/uL (ref 0.7–4.0)
MCH: 29.1 pg (ref 26.0–34.0)
MCHC: 32 g/dL (ref 30.0–36.0)
MCV: 91.1 fL (ref 80.0–100.0)
Monocytes Absolute: 0.4 10*3/uL (ref 0.1–1.0)
Monocytes Relative: 5 %
Neutro Abs: 4.5 10*3/uL (ref 1.7–7.7)
Neutrophils Relative %: 61 %
Platelets: 277 10*3/uL (ref 150–400)
RBC: 4.7 MIL/uL (ref 3.87–5.11)
RDW: 13.4 % (ref 11.5–15.5)
WBC: 7.4 10*3/uL (ref 4.0–10.5)
nRBC: 0 % (ref 0.0–0.2)

## 2019-07-07 LAB — RAPID URINE DRUG SCREEN, HOSP PERFORMED
Amphetamines: NOT DETECTED
Barbiturates: NOT DETECTED
Benzodiazepines: POSITIVE — AB
Cocaine: NOT DETECTED
Opiates: NOT DETECTED
Tetrahydrocannabinol: NOT DETECTED

## 2019-07-07 LAB — COMPREHENSIVE METABOLIC PANEL
ALT: 12 U/L (ref 0–44)
ALT: 12 U/L (ref 0–44)
AST: 17 U/L (ref 15–41)
AST: 15 U/L (ref 15–41)
Albumin: 4.1 g/dL (ref 3.5–5.0)
Albumin: 3.7 g/dL (ref 3.5–5.0)
Alkaline Phosphatase: 67 U/L (ref 38–126)
Alkaline Phosphatase: 58 U/L (ref 38–126)
Anion gap: 10 (ref 5–15)
Anion gap: 7 (ref 5–15)
BUN: 8 mg/dL (ref 6–20)
BUN: 6 mg/dL (ref 6–20)
CO2: 21 mmol/L — ABNORMAL LOW (ref 22–32)
CO2: 23 mmol/L (ref 22–32)
Calcium: 9.2 mg/dL (ref 8.9–10.3)
Calcium: 8.6 mg/dL — ABNORMAL LOW (ref 8.9–10.3)
Chloride: 107 mmol/L (ref 98–111)
Chloride: 109 mmol/L (ref 98–111)
Creatinine, Ser: 0.72 mg/dL (ref 0.44–1.00)
Creatinine, Ser: 0.63 mg/dL (ref 0.44–1.00)
GFR calc Af Amer: >60 mL/min (ref >60)
GFR calc Af Amer: >60 mL/min (ref >60)
GFR calc non Af Amer: >60 mL/min (ref >60)
GFR calc non Af Amer: >60 mL/min (ref >60)
Glucose, Bld: 85 mg/dL (ref 70–99)
Glucose, Bld: 78 mg/dL (ref 70–99)
Potassium: 4.1 mmol/L (ref 3.5–5.1)
Potassium: 4 mmol/L (ref 3.5–5.1)
Sodium: 138 mmol/L (ref 135–145)
Sodium: 139 mmol/L (ref 135–145)
Total Bilirubin: 0.6 mg/dL (ref 0.3–1.2)
Total Bilirubin: 0.6 mg/dL (ref 0.3–1.2)
Total Protein: 7.5 g/dL (ref 6.5–8.1)
Total Protein: 6.6 g/dL (ref 6.5–8.1)

## 2019-07-07 LAB — ETHANOL: Alcohol, Ethyl (B): <10 mg/dL (ref <10)

## 2019-07-07 LAB — RESPIRATORY PANEL BY RT PCR (FLU A&B, COVID)
Influenza A by PCR: NEGATIVE
Influenza B by PCR: NEGATIVE
SARS Coronavirus 2 by RT PCR: NEGATIVE

## 2019-07-07 LAB — SALICYLATE LEVEL: Salicylate Lvl: <7 mg/dL — ABNORMAL LOW (ref 7.0–30.0)

## 2019-07-07 LAB — ACETAMINOPHEN LEVEL
Acetaminophen (Tylenol), Serum: <10 ug/mL — ABNORMAL LOW (ref 10–30)
Acetaminophen (Tylenol), Serum: <10 ug/mL — ABNORMAL LOW (ref 10–30)

## 2019-07-07 LAB — HCG, QUANTITATIVE, PREGNANCY: hCG, Beta Chain, Quant, S: <1 m[IU]/mL (ref <5)

## 2019-07-07 MED ORDER — CHARCOAL ACTIVATED PO LIQD
50.0000 g | Freq: Once | ORAL | Status: AC
  Administered 2019-07-07: 50 g via ORAL
  Filled 2019-07-07: qty 240

## 2019-07-07 MED ORDER — LIDOCAINE HCL (PF) 2 % IJ SOLN: 10.0000 mL | Freq: Once | INTRAMUSCULAR | Status: DC

## 2019-07-07 MED ORDER — SODIUM CHLORIDE 0.9 % IV BOLUS (SEPSIS)
1000.0000 mL | Freq: Once | INTRAVENOUS | Status: AC
  Administered 2019-07-07: 12:00:00 1000 mL via INTRAVENOUS

## 2019-07-07 MED ORDER — ZOLPIDEM TARTRATE 5 MG PO TABS: 5.0000 mg | ORAL_TABLET | Freq: Every evening | ORAL | Status: DC | PRN

## 2019-07-07 NOTE — ED Notes (Signed)
Patient has spoken to multiple people on the phone. Patient has stated to multiple people that she took the medication "to just go to sleep," but has told this RN and 2 other RN's that she wanted to die and that she felt everything was all on her and on her shoulders. When RN was answering the mother's questions about the process going forward, she told the [facility] that she did not want anyone knowing what was going on.

## 2019-07-07 NOTE — ED Notes (Signed)
Pt changed into scrubs; pt belonging bag: clothes, shoes, watch, phone, and piercings. In cabinet by 1-4 nursing station.

## 2019-07-07 NOTE — ED Notes (Signed)
Labeled urine specimen and culture sent to lab. ENMiles 

## 2019-07-07 NOTE — ED Triage Notes (Addendum)
Per EMS- Patient works at a daycare and called EMS- Patient told EMS that she took 6 tabs Xanax 0.5 mg and 15 tabs of Ibuprofen 30 minutes prior to arrival to the ED. Patient told EMS that she was not SI/HI That she just wanted to go to sleep.  When Clinical research associate entered the room to triage. The patient stated "I want to die now." Patient then asked if she could go smoke a cigarette. Patient then stated "I am suicidal."

## 2019-07-07 NOTE — ED Provider Notes (Signed)
[facility] DEPT Provider Note   CSN: [id number] Arrival date & time: 07/07/19  1040     History Chief Complaint  Patient presents with  . Drug Overdose    Jodi Cochran is a 34 y.o. female.  Patient is a 34 year old female with past medical history of anxiety, depression, GERD presenting to the emergency department for intentional overdose of Ativan and a question of Tylenol or Motrin.  Patient reports that this morning when she woke up she took 1-1/2 pills of her 0.5 mg xanax tablets.  She reports shortly after that when she got to work she was feeling overwhelmed due to all of the children.  She works at preschool.  She reports that she then took the rest of what was in her bottle which she thinks was about 6 more pills.  She then reports dumping a handful of either ibuprofen or Tylenol in her hand and taking that as well.  Patient told the nurse that she was suicidal.  She tells me that she is not suicidal but does not care if she dies.  Reports that she is feeling very overwhelmed with life and that she intentionally tried to hurt herself when she ingested these pill this morning.  She currently reports that she feels "high".        Past Medical History:  Diagnosis Date  . Anemia   . Anxiety   . Depression   . GERD (gastroesophageal reflux disease)   . HPV in female   . IBS (irritable bowel syndrome)   . Vitamin D deficiency 03/08/2018    Patient Active Problem List   Diagnosis Date Noted  . Vitamin D deficiency 03/08/2018  . Family history of diabetes mellitus (DM) 03/07/2018  . HPV in female   . ESOPHAGEAL REFLUX 04/26/2009  . HEMORRHAGE OF RECTUM AND ANUS 04/26/2009  . ABDOMINAL PAIN, EPIGASTRIC 04/26/2009    Past Surgical History:  Procedure Laterality Date  . ESOPHAGOGASTRODUODENOSCOPY ENDOSCOPY    . NO PAST SURGERIES    . ORIF CLAVICULAR FRACTURE Left 12/12/2018   Procedure: OPEN REDUCTION INTERNAL FIXATION (ORIF) CLAVICULAR  FRACTURE;  Surgeon: Tania Ade, MD;  Location: [facility];  Service: Orthopedics;  Laterality: Left;     OB History    Gravida  2   Para  1   Term      Preterm  1   AB      Living  1     SAB      TAB      Ectopic      Multiple      Live Births  1           Family History  Problem Relation Age of Onset  . Hypertension Mother   . Depression Mother   . Hypertension Maternal Grandmother   . Diabetes Maternal Grandmother   . Glaucoma Maternal Grandmother   . Thyroid disease Maternal Grandmother   . Depression Maternal Grandmother   . Glaucoma Paternal Grandmother   . Osteoarthritis Paternal Grandmother   . Bone cancer Paternal Grandmother     Social History   Tobacco Use  . Smoking status: Current Every Day Smoker    Packs/day: 0.25    Types: Cigars    Last attempt to quit: 07/01/2017    Years since quitting: 2.0  . Smokeless tobacco: Never Used  Substance Use Topics  . Alcohol use: Yes  . Drug use: Not Currently    Home Medications Prior to Admission  medications   Medication Sig Start Date End Date Taking? Authorizing Provider  ALPRAZolam Prudy Feeler) 0.5 MG tablet Take 0.5 mg by mouth at bedtime as needed for sleep. 11/28/18  Yes [provider]  Ascorbic Acid (VITAMIN C PO) Take 1 tablet by mouth daily.   Yes [provider]  Cholecalciferol (VITAMIN D3 PO) Take 1 capsule by mouth daily.   Yes [provider]  citalopram (CELEXA) 20 MG tablet Take 1 tablet (20 mg total) by mouth daily. 04/15/18  Yes Henson, [name] L, NP-C  ELDERBERRY PO Take 1 tablet by mouth daily.   Yes [provider]  hydrOXYzine (ATARAX/VISTARIL) 25 MG tablet Take 25 mg by mouth daily. 07/02/19  Yes [provider]  ibuprofen (ADVIL) 200 MG tablet Take 400 mg by mouth every 6 (six) hours as needed for headache or moderate pain.   Yes [provider]  amoxicillin (AMOXIL) 875 MG tablet Take 1 tablet (875 mg total) by mouth 2 (two)  times daily. Patient not taking: Reported on 11/30/2018 03/08/18   Hetty Blend L, NP-C  cetirizine (ZYRTEC) 10 MG tablet Take 1 tablet (10 mg total) by mouth daily. Patient not taking: Reported on 11/30/2018 04/03/18   Little, Ambrose Finland, MD  dicyclomine (BENTYL) 20 MG tablet Take 1 tablet (20 mg total) by mouth 2 (two) times daily. Patient not taking: Reported on 03/07/2018 10/04/17   Renne Crigler, PA-C  fluticasone [facility]) 50 MCG/ACT nasal spray Place 1 spray into both nostrils daily. Patient not taking: Reported on 11/30/2018 04/03/18   Little, Ambrose Finland, MD  methocarbamol (ROBAXIN) 500 MG tablet Take 2 tablets (1,000 mg total) by mouth 4 (four) times daily as needed for muscle spasms (muscle spasm/pain). Patient not taking: Reported on 07/07/2019 12/12/18   Jiles Harold, PA-C  ondansetron (ZOFRAN ODT) 4 MG disintegrating tablet Take 1 tablet (4 mg total) by mouth every 8 (eight) hours as needed for nausea or vomiting. Patient not taking: Reported on 03/07/2018 10/04/17   Renne Crigler, PA-C  oxyCODONE-acetaminophen (PERCOCET) 5-325 MG tablet Take 1-2 tablets every 4 hours as needed for post operative pain. MAX 6/day Patient not taking: Reported on 07/07/2019 12/12/18   Jiles Harold, PA-C  Vitamin D, Ergocalciferol, (DRISDOL) 1.25 MG (50000 UT) CAPS capsule Take 1 capsule (50,000 Units total) by mouth every 7 (seven) days. Patient not taking: Reported on 11/30/2018 03/08/18   Avanell Shackleton, NP-C    Allergies    Patient has no known allergies.  Review of Systems   Review of Systems  Constitutional: Negative for chills and fever.  Respiratory: Negative for cough and shortness of breath.   Neurological: Positive for light-headedness.  Psychiatric/Behavioral: Positive for dysphoric mood, self-injury and sleep disturbance. The patient is nervous/anxious.     Physical Exam Updated Vital Signs BP 108/76   Pulse 62   Temp 98.8 F (37.1 C) (Oral)   Resp 16   Ht 5'  11" (1.803 m)   Wt 77.1 kg   LMP 06/16/2019   SpO2 98%   BMI 23.71 kg/m   Physical Exam Vitals and nursing note reviewed.  Constitutional:      Appearance: Normal appearance.  HENT:     Head: Normocephalic.     Mouth/Throat:     Mouth: Mucous membranes are moist.  Eyes:     Conjunctiva/sclera: Conjunctivae normal.     Pupils: Pupils are equal, round, and reactive to light.     Comments: Congenital strabismus  Cardiovascular:     Rate  and Rhythm: Normal rate and regular rhythm.  Pulmonary:     Effort: Pulmonary effort is normal.  Skin:    General: Skin is dry.  Neurological:     General: No focal deficit present.     Mental Status: She is alert and oriented to person, place, and time.  Psychiatric:        Mood and Affect: Mood normal.     ED Results / Procedures / Treatments   Labs (all labs ordered are listed, but only abnormal results are displayed) Labs Reviewed  COMPREHENSIVE METABOLIC PANEL - Abnormal; Notable for the following components:      Result Value   CO2 21 (*)    All other components within normal limits  SALICYLATE LEVEL - Abnormal; Notable for the following components:   Salicylate Lvl <7.0 (*)    All other components within normal limits  ACETAMINOPHEN LEVEL - Abnormal; Notable for the following components:   Acetaminophen (Tylenol), Serum <10 (*)    All other components within normal limits  ETHANOL  CBC WITH DIFFERENTIAL/PLATELET  HCG, QUANTITATIVE, PREGNANCY  RAPID URINE DRUG SCREEN, HOSP PERFORMED  ACETAMINOPHEN LEVEL  COMPREHENSIVE METABOLIC PANEL    EKG None  Radiology No results found.  Procedures Procedures (including critical care time)  Medications Ordered in ED Medications  sodium chloride 0.9 % bolus 1,000 mL (1,000 mLs Intravenous New Bag/Given 07/07/19 1147)  charcoal activated (NO SORBITOL) (ACTIDOSE-AQUA) suspension 50 g (50 g Oral Given 07/07/19 1143)    ED Course  I have reviewed the triage vital signs and the  nursing notes.  Pertinent labs & imaging results that were available during my care of the patient were reviewed by me and considered in my medical decision making (see chart for details).  Clinical Course as of Jul 07 1511  Mon Jul 07, 2019  1129 Patient with intentional overdose of ativan, ?motrin or tylenol today due to feeling overwhelmed. Currently voluntary. Spoke with poison control who suggest activated charcoal, monitor for 6 hours from ingestion and recheck 4 hour labs and tylenol level. Will need IVC if she wants to leave.    [KM]  1413 If repeat labs normal then patient can be medically cleared for psych eval at 1600   [KM]  1508 Patient care signed out to Army Melia PA due to change of shift for f/u on labs and consult TTS when medically cleared.    [KM]    Clinical Course User Index [KM] Jeral Pinch   MDM Rules/Calculators/A&P                       Final Clinical Impression(s) / ED Diagnoses Final diagnoses:  Intentional drug overdose, initial encounter [facility])    Rx / DC Orders ED Discharge Orders    None       Alynah, Schone 07/07/19 1513    Tegeler, Canary Brim, MD 07/07/19 1606

## 2019-07-07 NOTE — ED Notes (Signed)
TTS clinician attempted to complete assessment. Per Lanora Manis, patient unable to complete assessment at this time due to being medicated, unable to awaken. TTS will complete assessment at later time.

## 2019-07-07 NOTE — ED Notes (Signed)
Patient speaking to someone else on the phone

## 2019-07-07 NOTE — ED Notes (Addendum)
Patient's mother visiting with patient. Patient calm and cooperative at this time. Pt's mother reports she has never attempted self harm before and has never made suicidal comments before. Patient's mother is tearful and supportive. Patient's mother reports multiple family members and friends as a support system for patient.   RN explained to patient's mother that patient would be staying here for the night and the doctor's would be rounding in the morning. RN explained that we take safety very seriously. RN gave patient's mother number to call if she wanted to talk to patient.  Patient's mother asked if this was an intentional overdose, RN states that at this time we are not sure and are just taking care of her until she is seen by the counselor.

## 2019-07-07 NOTE — ED Provider Notes (Signed)
34yo female with overdose xanax and "500mg  Advil." Awaiting repeat tylenol level then clear to TTS for disposition. IVC if tries to leave prior to dispo.  Physical Exam  BP 138/87 (BP Location: Right Arm)   Pulse 61   Temp 98.2 F (36.8 C) (Oral)   Resp 18   Ht 5\' 11"  (1.803 m)   Wt 77.1 kg   LMP 06/16/2019   SpO2 100%   BMI 23.71 kg/m   Physical Exam  ED Course/Procedures   Clinical Course as of Jul 06 2352  Mon Jul 07, 2019  1129 Patient with intentional overdose of ativan, ?motrin or tylenol today due to feeling overwhelmed. Currently voluntary. Spoke with poison control who suggest activated charcoal, monitor for 6 hours from ingestion and recheck 4 hour labs and tylenol level. Will need IVC if she wants to leave.    [KM]  1413 If repeat labs normal then patient can be medically cleared for psych eval at 1600   [KM]  1508 Patient care signed out to Jul 09, 2019 PA due to change of shift for f/u on labs and consult TTS when medically cleared.    [KM]    Clinical Course User Index [KM] Ranya, Fiddler, PA-C    Procedures  MDM  1528hrs patient attempted to leave the department, placed under IVC by Dr. Army Melia. 1721hrs repeat tylenol level unchanged, patient is medically cleared for behavioral health evaluation.       Arlyn Dunning, PA-C 07/07/19 2354    Jeannie Fend, MD 07/08/19 [phone]

## 2019-07-07 NOTE — ED Notes (Addendum)
Patient began making statements about "wanting to leave and I don't want to be here" Patient was demanding her belongings so she could leave.  Patient not felt to be in a safe frame of mind. MD made aware.

## 2019-07-07 NOTE — ED Notes (Signed)
Spoke to poison control and they are closing patients case.

## 2019-07-08 ENCOUNTER — Inpatient Hospital Stay (HOSPITAL_COMMUNITY)
Admission: AD | Admit: 2019-07-08 | Discharge: 2019-07-10 | DRG: 885 | Disposition: A | Discharge status: Home / Self Care | Payer: Medicaid Other | Source: Intra-hospital | Attending: Psychiatry | Admitting: Psychiatry

## 2019-07-08 ENCOUNTER — Encounter (HOSPITAL_COMMUNITY): Payer: Self-pay | Admitting: Psychiatry

## 2019-07-08 DIAGNOSIS — T424X1A Poisoning by benzodiazepines, accidental (unintentional), initial encounter: Secondary | ICD-10-CM | POA: Diagnosis not present

## 2019-07-08 DIAGNOSIS — F17290 Nicotine dependence, other tobacco product, uncomplicated: Secondary | ICD-10-CM | POA: Diagnosis present

## 2019-07-08 DIAGNOSIS — F322 Major depressive disorder, single episode, severe without psychotic features: Principal | ICD-10-CM | POA: Diagnosis present

## 2019-07-08 DIAGNOSIS — F410 Panic disorder [episodic paroxysmal anxiety] without agoraphobia: Secondary | ICD-10-CM | POA: Diagnosis present

## 2019-07-08 DIAGNOSIS — F329 Major depressive disorder, single episode, unspecified: Secondary | ICD-10-CM

## 2019-07-08 DIAGNOSIS — T50904A Poisoning by unspecified drugs, medicaments and biological substances, undetermined, initial encounter: Secondary | ICD-10-CM

## 2019-07-08 DIAGNOSIS — J309 Allergic rhinitis, unspecified: Secondary | ICD-10-CM | POA: Diagnosis present

## 2019-07-08 DIAGNOSIS — F332 Major depressive disorder, recurrent severe without psychotic features: Secondary | ICD-10-CM | POA: Diagnosis present

## 2019-07-08 DIAGNOSIS — Z20822 Contact with and (suspected) exposure to covid-19: Secondary | ICD-10-CM | POA: Diagnosis present

## 2019-07-08 DIAGNOSIS — F419 Anxiety disorder, unspecified: Secondary | ICD-10-CM

## 2019-07-08 DIAGNOSIS — T39312A Poisoning by propionic acid derivatives, intentional self-harm, initial encounter: Secondary | ICD-10-CM | POA: Diagnosis present

## 2019-07-08 DIAGNOSIS — T50901A Poisoning by unspecified drugs, medicaments and biological substances, accidental (unintentional), initial encounter: Secondary | ICD-10-CM

## 2019-07-08 DIAGNOSIS — Z818 Family history of other mental and behavioral disorders: Secondary | ICD-10-CM

## 2019-07-08 DIAGNOSIS — T424X2A Poisoning by benzodiazepines, intentional self-harm, initial encounter: Secondary | ICD-10-CM | POA: Diagnosis present

## 2019-07-08 DIAGNOSIS — T1491XA Suicide attempt, initial encounter: Secondary | ICD-10-CM | POA: Diagnosis present

## 2019-07-08 MED ORDER — CHLORDIAZEPOXIDE HCL 25 MG PO CAPS: 25.0000 mg | ORAL_CAPSULE | Freq: Four times a day (QID) | ORAL | Status: DC | PRN

## 2019-07-08 MED ORDER — HYDROXYZINE HCL 25 MG PO TABS: 25.0000 mg | ORAL_TABLET | Freq: Four times a day (QID) | ORAL | Status: DC | PRN

## 2019-07-08 MED ORDER — ALUM & MAG HYDROXIDE-SIMETH 200-200-20 MG/5ML PO SUSP: 30.0000 mL | ORAL | Status: DC | PRN

## 2019-07-08 MED ORDER — CITALOPRAM HYDROBROMIDE 20 MG PO TABS
20.0000 mg | ORAL_TABLET | Freq: Every day | ORAL | Status: DC
  Filled 2019-07-08 (×6): qty 1

## 2019-07-08 MED ORDER — ADULT MULTIVITAMIN W/MINERALS CH
1.0000 | ORAL_TABLET | Freq: Every day | ORAL | Status: DC
  Filled 2019-07-08 (×5): qty 1

## 2019-07-08 MED ORDER — ACETAMINOPHEN 325 MG PO TABS: 650.0000 mg | ORAL_TABLET | Freq: Four times a day (QID) | ORAL | Status: DC | PRN

## 2019-07-08 MED ORDER — MAGNESIUM HYDROXIDE 400 MG/5ML PO SUSP: 30.0000 mL | Freq: Every day | ORAL | Status: DC | PRN

## 2019-07-08 MED ORDER — HYDROXYZINE HCL 25 MG PO TABS
25.0000 mg | ORAL_TABLET | Freq: Every day | ORAL | Status: DC
  Filled 2019-07-08 (×2): qty 1

## 2019-07-08 MED ORDER — THIAMINE HCL 100 MG PO TABS
100.0000 mg | ORAL_TABLET | Freq: Every day | ORAL | Status: DC
  Filled 2019-07-08 (×4): qty 1

## 2019-07-08 NOTE — ED Notes (Signed)
Patient agrees to go to [facility] voluntarily.

## 2019-07-08 NOTE — Consult Note (Signed)
  Jodi Cochran, 34 y.o., female patient seen via tele psych by this provider, consulted with Dr. Lucianne Muss; and chart reviewed on 07/08/19.  On evaluation Jodi Cochran reports that she did take an overdose of ibuprofen and Xanax while she was at school yesterday.  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  Patient states that she has been feeling overwhelmed lately and does not know why she took the overdose but does not feel it was a suicide attempt.  Patient relates that she lives at home with her younger children and wants to go home.  States that she can have her mother to stay with her and is willing to do outpatient services.  Patient reporting to TTS that her one of her main stressors with her mother.  Patient informed that after a suicide attempt it is recommended that she come inpatient. During evaluation Jodi Cochran is alert/oriented x 4; calm/cooperative; and mood is congruent with affect. She does not appear to be responding to internal/external stimuli or delusional thoughts.  Patient denies suicidal/self-harm/homicidal ideation, psychosis, and paranoia.  Patient answered question appropriately.     Disposition: Recommend psychiatric Inpatient admission when medically cleared.

## 2019-07-08 NOTE — ED Notes (Signed)
Sheriff to pick up patient

## 2019-07-08 NOTE — ED Notes (Signed)
Patient refusing to go to [facility] notified, [facility] notified, Arts administrator notified, Building surveyor notified. Noted in chart that if patient refuses "she will be IVC" per note from [place]. NP Shuvon called about patient refusing transfer no answer. Dr. Freida Busman notified, at bedside. Patient stating that she is ok discharging home to mother, stating that she has a daughter "to take care of".

## 2019-07-08 NOTE — Progress Notes (Signed)
Pt admitted involuntarily to [facility].  This is patient's first admission to a behavioral facility.  Patient works as a Architectural technologist.  She reports while at work yesterday she began to feel anxious.  She took a Xanax (prescribed).  She stated it didn't seem to help so she took 4 more.  She also reports taking approximately 10 Ibuprofen but was unable to verbalize why.  Patient denied any known stressor and denies this was a suicide attempt.  She states immediately after taking the medication she told a fellow employee.  Patient denied HI and AVH.  Fifteen minute checks initiated for patient safety.  Pt safe on unit.  Report given to patient's nurse.

## 2019-07-08 NOTE — Progress Notes (Signed)
The patient shared in group that today was a bad day for her and that she is grateful for being "alive". Her goal for tomorrow is to get discharged.

## 2019-07-08 NOTE — H&P (Signed)
Psychiatric Admission Assessment Adult  Patient Identification: Jodi Cochran MRN:  [id number] Date of Evaluation:  07/08/2019 Chief Complaint:   " I was not trying to kill myself , I just had a bad panic attack" Principal Diagnosis: S/P Overdose  Diagnosis:  S/P Overdose  History of Present Illness: 77 y old female, presented to ED on 4/26 via EMS. Explains she was at work and had a significant panic attack. Reports she took about 5 ( 0.5 mgrs) of Xanax , which she is prescribed , and about 10 tablets of Ibuprofen. States she then told her employer, who called 911.Currently patient denies any suicidal intent, and states that at the time she was experiencing a severe panic attack  and also a significant headache. Describes having palpitations ," shaking", crying , feeling short of breath. As per ED chart notes she had  made statement of wanting to die. Currently denies suicidal intent and states  , " I was not trying to die, I was just trying to get some relief". She is unsure what may have triggered the panic attack, and states " maybe I drank too much caffeine that morning". She reports a history of depression and anxiety , but states that in general she has been doing better on Celexa . She denies having had any suicidal ideations leading up to event/admission. Currently minimizes depression and states " I think my mood has been OK". She does endorse some neuro-vegetative symptoms as below, but denies anhedonia or pervasive sense of sadness  Associated Signs/Symptoms: Depression Symptoms:  anxiety, panic attacks, loss of energy/fatigue, decreased appetite, (Hypo) Manic Symptoms:  None noted or endorsed Anxiety Symptoms:  Reports history of anxiety but states that in general her anxiety has decreased with treatment. As above, reports having had a panic attack on day of admission Psychotic Symptoms:  Denies  PTSD Symptoms: Denies  Total Time spent with patient: 45 minutes  Past Psychiatric  History: no prior psychiatric admissions, denies prior history of suicide attempts of  self cutting . Reports she has been diagnosed with Depression and Anxiety, for which she was started on Celexa 2 + years ago, which she reports she takes regularly. Endorses history of prior panic attacks, denies history of agoraphobia Denies history of psychosis,denies history of mania or hypomania, denies history of PTSD. Denies history of violence  Is the patient at risk to self? Yes.    Has the patient been a risk to self in the past 6 months? No.  Has the patient been a risk to self within the distant past? No.  Is the patient a risk to others? No.  Has the patient been a risk to others in the past 6 months? No.  Has the patient been a risk to others within the distant past? No.   Prior Inpatient Therapy:  denies  Prior Outpatient Therapy:  Has a psychiatrist at [facility]   Alcohol Screening:   Substance Abuse History in the last 12 months: denies alcohol abuse. Denies drug abuse, and denies abusing or misusing prescribed Xanax, which she states she takes occasionally Consequences of Substance Abuse: Denies  Previous Psychotropic Medications: Celexa 30 mgrs QDAY ( x 2 + years ) , Xanax 0.5 mgrs QHS PRN , states that she was taking irregularly and often would not take it for days, denies abusing or misusing .  Psychological Evaluations: No Past Medical History: denies medical illnesses other than IBS, NKDA ( Phenergan causes anxiety)  Past Medical History:  Diagnosis Date  .  Anemia   . Anxiety   . Depression   . GERD (gastroesophageal reflux disease)   . HPV in female   . IBS (irritable bowel syndrome)   . Vitamin D deficiency 03/08/2018    Past Surgical History:  Procedure Laterality Date  . ESOPHAGOGASTRODUODENOSCOPY ENDOSCOPY    . NO PAST SURGERIES    . ORIF CLAVICULAR FRACTURE Left 12/12/2018   Procedure: OPEN REDUCTION INTERNAL FIXATION (ORIF) CLAVICULAR FRACTURE;  Surgeon: Jones Broom, MD;  Location: [facility];  Service: Orthopedics;  Laterality: Left;   Family History: parents alive, has a half sister  Family History  Problem Relation Age of Onset  . Hypertension Mother   . Depression Mother   . Hypertension Maternal Grandmother   . Diabetes Maternal Grandmother   . Glaucoma Maternal Grandmother   . Thyroid disease Maternal Grandmother   . Depression Maternal Grandmother   . Glaucoma Paternal Grandmother   . Osteoarthritis Paternal Grandmother   . Bone cancer Paternal Grandmother    Family Psychiatric  History: mother and aunt have history of depression and anxiety. No suicides in family. No alcohol use disorder in family Tobacco Screening:  smokes one cigar per day Social History: 73, has a 75 y old daughter, employed.  Social History   Substance and Sexual Activity  Alcohol Use Yes  . Alcohol/week: 1.0 standard drinks  . Types: 1 Glasses of wine per week   Comment: qod     Social History   Substance and Sexual Activity  Drug Use Not Currently    Additional Social History:  Allergies:   Allergies  Allergen Reactions  . Shellfish Allergy Itching  . Phenergan [Promethazine Hcl] Anxiety   Lab Results:  Results for orders placed or performed during the hospital encounter of 07/07/19 (from the past 48 hour(s))  Comprehensive metabolic panel     Status: Abnormal   Collection Time: 07/07/19 11:43 AM  Result Value Ref Range   Sodium 138 135 - 145 mmol/L   Potassium 4.1 3.5 - 5.1 mmol/L   Chloride 107 98 - 111 mmol/L   CO2 21 (L) 22 - 32 mmol/L   Glucose, Bld 85 70 - 99 mg/dL    Comment: Glucose reference range applies only to samples taken after fasting for at least 8 hours.   BUN 8 6 - 20 mg/dL   Creatinine, Ser 1.61 0.44 - 1.00 mg/dL   Calcium 9.2 8.9 - 09.6 mg/dL   Total Protein 7.5 6.5 - 8.1 g/dL   Albumin 4.1 3.5 - 5.0 g/dL   AST 17 15 - 41 U/L   ALT 12 0 - 44 U/L   Alkaline Phosphatase 67 38 - 126 U/L   Total Bilirubin 0.6 0.3 - 1.2  mg/dL   GFR calc non Af Amer >60 >60 mL/min   GFR calc Af Amer >60 >60 mL/min   Anion gap 10 5 - 15    Comment: Performed at [facility], [address] [place], [place] [address]  Salicylate level     Status: Abnormal   Collection Time: 07/07/19 11:43 AM  Result Value Ref Range   Salicylate Lvl <7.0 (L) 7.0 - 30.0 mg/dL    Comment: Performed at [facility], [address], [place], [place] [address]  Acetaminophen level     Status: Abnormal   Collection Time: 07/07/19 11:43 AM  Result Value Ref Range   Acetaminophen (Tylenol), Serum <10 (L) 10 - 30 ug/mL    Comment: (NOTE) Therapeutic  concentrations vary significantly. A range of 10-30 ug/mL  may be an effective concentration for many patients. However, some  are best treated at concentrations outside of this range. Acetaminophen concentrations >150 ug/mL at 4 hours after ingestion  and >50 ug/mL at 12 hours after ingestion are often associated with  toxic reactions. Performed at [facility], [address], [place], [place] [address]   Ethanol     Status: None   Collection Time: 07/07/19 11:43 AM  Result Value Ref Range   Alcohol, Ethyl (B) <10 <10 mg/dL    Comment: (NOTE) Lowest detectable limit for serum alcohol is 10 mg/dL. For medical purposes only. Performed at [facility], [address], [place], [place] [address]   CBC WITH DIFFERENTIAL     Status: None   Collection Time: 07/07/19 11:43 AM  Result Value Ref Range   WBC 7.4 4.0 - 10.5 K/uL   RBC 4.70 3.87 - 5.11 MIL/uL   Hemoglobin 13.7 12.0 - 15.0 g/dL   HCT 39.0 30.0 - 92.3 %   MCV 91.1 80.0 - 100.0 fL   MCH 29.1 26.0 - 34.0 pg   MCHC 32.0 30.0 - 36.0 g/dL   RDW 30.0 76.2 - 26.3 %   Platelets 277 150 - 400 K/uL   nRBC 0.0 0.0 - 0.2 %   Neutrophils Relative % 61 %   Neutro Abs 4.5 1.7 - 7.7 K/uL   Lymphocytes Relative 31 %   Lymphs Abs 2.3 0.7 - 4.0 K/uL   Monocytes Relative 5  %   Monocytes Absolute 0.4 0.1 - 1.0 K/uL   Eosinophils Relative 2 %   Eosinophils Absolute 0.2 0.0 - 0.5 K/uL   Basophils Relative 1 %   Basophils Absolute 0.0 0.0 - 0.1 K/uL   Immature Granulocytes 0 %   Abs Immature Granulocytes 0.01 0.00 - 0.07 K/uL    Comment: Performed at [facility], [address] [place], [place] [address]  hCG, quantitative, pregnancy     Status: None   Collection Time: 07/07/19 11:43 AM  Result Value Ref Range   hCG, Beta Chain, Quant, S <1 <5 mIU/mL    Comment:          GEST. AGE      CONC.  (mIU/mL)   <=1 WEEK        5 - 50     2 WEEKS       50 - 500     3 WEEKS       100 - 10,000     4 WEEKS     1,000 - 30,000     5 WEEKS     3,500 - 115,000   6-8 WEEKS     12,000 - 270,000    12 WEEKS     15,000 - 220,000        FEMALE AND NON-PREGNANT FEMALE:     LESS THAN 5 mIU/mL Performed at [facility], [address], [place], [place] [address]   Urine rapid drug screen (hosp performed)     Status: Abnormal   Collection Time: 07/07/19  3:43 PM  Result Value Ref Range   Opiates NONE DETECTED NONE DETECTED   Cocaine NONE DETECTED NONE DETECTED   Benzodiazepines POSITIVE (A) NONE DETECTED   Amphetamines NONE DETECTED NONE DETECTED   Tetrahydrocannabinol NONE DETECTED NONE DETECTED   Barbiturates NONE DETECTED NONE DETECTED    Comment: (NOTE) DRUG SCREEN FOR MEDICAL PURPOSES ONLY.  IF CONFIRMATION IS NEEDED FOR ANY PURPOSE, NOTIFY LAB WITHIN 5 DAYS. LOWEST DETECTABLE LIMITS FOR URINE DRUG SCREEN Drug Class                     Cutoff (ng/mL) Amphetamine and metabolites    1000 Barbiturate and metabolites    200 Benzodiazepine                 200 Tricyclics and metabolites     300 Opiates and metabolites        300 Cocaine and metabolites        300 THC                            50 Performed at [facility], [address] [place], [place] [address]   Acetaminophen level     Status:  Abnormal   Collection Time: 07/07/19  3:43 PM  Result Value Ref Range   Acetaminophen (Tylenol), Serum <10 (L) 10 - 30 ug/mL    Comment: (NOTE) Therapeutic concentrations vary significantly. A range of 10-30 ug/mL  may be an effective concentration for many patients. However, some  are best treated at concentrations outside of this range. Acetaminophen concentrations >150 ug/mL at 4 hours after ingestion  and >50 ug/mL at 12 hours after ingestion are often associated with  toxic reactions. Performed at [facility], [address] [place], [place] [address]   Comprehensive metabolic panel     Status: Abnormal   Collection Time: 07/07/19  3:43 PM  Result Value Ref Range   Sodium 139 135 - 145 mmol/L   Potassium 4.0 3.5 - 5.1 mmol/L   Chloride 109 98 - 111 mmol/L   CO2 23 22 - 32 mmol/L   Glucose, Bld 78 70 - 99 mg/dL    Comment: Glucose reference range applies only to samples taken after fasting for at least 8 hours.   BUN 6 6 - 20 mg/dL   Creatinine, Ser 3.22 0.44 - 1.00 mg/dL   Calcium 8.6 (L) 8.9 - 10.3 mg/dL   Total Protein 6.6 6.5 - 8.1 g/dL   Albumin 3.7 3.5 - 5.0 g/dL   AST 15 15 - 41 U/L   ALT 12 0 - 44 U/L   Alkaline Phosphatase 58 38 - 126 U/L   Total Bilirubin 0.6 0.3 - 1.2 mg/dL   GFR calc non Af Amer >60 >60 mL/min   GFR calc Af Amer >60 >60 mL/min   Anion gap 7 5 - 15    Comment: Performed at [facility], [address], [place], [place] [address]  Respiratory Panel by RT PCR (Flu A&B, Covid) - Nasopharyngeal Swab     Status: None   Collection Time: 07/07/19  6:44 PM   Specimen: Nasopharyngeal Swab  Result Value Ref Range   SARS Coronavirus 2 by RT PCR NEGATIVE NEGATIVE    Comment: (NOTE) SARS-CoV-2 target nucleic acids are NOT DETECTED. The SARS-CoV-2 RNA is generally detectable in upper respiratoy specimens during the acute phase of infection. The lowest concentration of SARS-CoV-2 viral copies this assay can detect  is 131 copies/mL. A negative result does not preclude SARS-Cov-2 infection and should not be used as the sole basis for treatment or other patient management decisions. A negative result may occur with  improper specimen collection/handling, submission of specimen other than nasopharyngeal swab, presence of viral mutation(s) within the areas targeted by this  assay, and inadequate number of viral copies (<131 copies/mL). A negative result must be combined with clinical observations, patient history, and epidemiological information. The expected result is Negative. Fact Sheet for Patients:  PinkCheek.be Fact Sheet for Healthcare Providers:  GravelBags.it This test is not yet ap proved or cleared by the [place] FDA and  has been authorized for detection and/or diagnosis of SARS-CoV-2 by FDA under an Emergency Use Authorization (EUA). This EUA will remain  in effect (meaning this test can be used) for the duration of the COVID-19 declaration under Section 564(b)(1) of the Act, 21 U.S.C. section 360bbb-3(b)(1), unless the authorization is terminated or revoked sooner.    Influenza A by PCR NEGATIVE NEGATIVE   Influenza B by PCR NEGATIVE NEGATIVE    Comment: (NOTE) The Xpert Xpress SARS-CoV-2/FLU/RSV assay is intended as an aid in  the diagnosis of influenza from Nasopharyngeal swab specimens and  should not be used as a sole basis for treatment. Nasal washings and  aspirates are unacceptable for Xpert Xpress SARS-CoV-2/FLU/RSV  testing. Fact Sheet for Patients: PinkCheek.be Fact Sheet for Healthcare Providers: GravelBags.it This test is not yet approved or cleared by the [place] FDA and  has been authorized for detection and/or diagnosis of SARS-CoV-2 by  FDA under an Emergency Use Authorization (EUA). This EUA will remain  in effect (meaning this test can be  used) for the duration of the  Covid-19 declaration under Section 564(b)(1) of the Act, 21  U.S.C. section 360bbb-3(b)(1), unless the authorization is  terminated or revoked. Performed at [facility], [place] [address], [place], [place] [address]     Blood Alcohol level:  Lab Results  Component Value Date   ETH <10 50/11/3816    Metabolic Disorder Labs:  Lab Results  Component Value Date   HGBA1C 5.5 03/07/2018   No results found for: PROLACTIN Lab Results  Component Value Date   CHOL 192 01/30/2017   TRIG 68 01/30/2017   HDL 64 01/30/2017   CHOLHDL 3.0 01/30/2017   LDLCALC 113 (H) 01/30/2017    Current Medications: No current facility-administered medications for this encounter.   PTA Medications: Medications Prior to Admission  Medication Sig Dispense Refill Last Dose  . ALPRAZolam (XANAX) 0.5 MG tablet Take 0.5 mg by mouth at bedtime as needed for sleep.     Marland Kitchen amoxicillin (AMOXIL) 875 MG tablet Take 1 tablet (875 mg total) by mouth 2 (two) times daily. (Patient not taking: Reported on 11/30/2018) 20 tablet 0   . Ascorbic Acid (VITAMIN C PO) Take 1 tablet by mouth daily.     . cetirizine (ZYRTEC) 10 MG tablet Take 1 tablet (10 mg total) by mouth daily. (Patient not taking: Reported on 11/30/2018) 30 tablet 0   . Cholecalciferol (VITAMIN D3 PO) Take 1 capsule by mouth daily.     . citalopram (CELEXA) 20 MG tablet Take 1 tablet (20 mg total) by mouth daily. 30 tablet 0   . dicyclomine (BENTYL) 20 MG tablet Take 1 tablet (20 mg total) by mouth 2 (two) times daily. (Patient not taking: Reported on 03/07/2018) 20 tablet 0   . ELDERBERRY PO Take 1 tablet by mouth daily.     . fluticasone (FLONASE) 50 MCG/ACT nasal spray Place 1 spray into both nostrils daily. (Patient not taking: Reported on 11/30/2018) 16 g 0   . hydrOXYzine (ATARAX/VISTARIL) 25 MG tablet Take 25 mg by mouth daily.     Marland Kitchen ibuprofen (ADVIL) 200 MG tablet Take 400 mg  by mouth every 6 (six) hours  as needed for headache or moderate pain.     . methocarbamol (ROBAXIN) 500 MG tablet Take 2 tablets (1,000 mg total) by mouth 4 (four) times daily as needed for muscle spasms (muscle spasm/pain). (Patient not taking: Reported on 07/07/2019) 25 tablet 0   . ondansetron (ZOFRAN ODT) 4 MG disintegrating tablet Take 1 tablet (4 mg total) by mouth every 8 (eight) hours as needed for nausea or vomiting. (Patient not taking: Reported on 03/07/2018) 10 tablet 0   . oxyCODONE-acetaminophen (PERCOCET) 5-325 MG tablet Take 1-2 tablets every 4 hours as needed for post operative pain. MAX 6/day (Patient not taking: Reported on 07/07/2019) 30 tablet 0   . Vitamin D, Ergocalciferol, (DRISDOL) 1.25 MG (50000 UT) CAPS capsule Take 1 capsule (50,000 Units total) by mouth every 7 (seven) days. (Patient not taking: Reported on 11/30/2018) 12 capsule 0     Musculoskeletal: Strength & Muscle Tone: within normal limits Gait & Station: normal Patient leans: N/A  Psychiatric Specialty Exam: Physical Exam  Review of Systems  Constitutional: Negative.   HENT: Negative.   Eyes: Negative.   Respiratory: Negative.  Negative for cough and shortness of breath.   Cardiovascular: Negative.  Negative for chest pain.  Gastrointestinal: Negative.  Negative for nausea and vomiting.  Endocrine: Negative.   Genitourinary: Negative.   Musculoskeletal: Negative.   Skin: Negative.  Negative for rash.  Allergic/Immunologic: Negative.   Neurological: Negative for seizures and headaches.  Psychiatric/Behavioral:       Anxiety/panic attack  All other systems reviewed and are negative.   Blood pressure (!) 139/105, pulse 84, temperature 99.3 F (37.4 C), resp. rate 18, last menstrual period 06/16/2019, SpO2 98 %.There is no height or weight on file to calculate BMI.  General Appearance: Well Groomed  Eye Contact:  Good  Speech:  Normal Rate  Volume:  Normal  Mood:  currently minimizes depression, states " my mood is OK"  Affect:   appropriate, reactive   Thought Process:  Linear and Descriptions of Associations: Intact  Orientation:  Other:  fully alert and attentive, oriented x 3  Thought Content:  no hallucinations, no delusions, not internally preoccupied   Suicidal Thoughts:  No- denies suicidal or self injurious ideations, contracts for safety  Homicidal Thoughts:  No  Memory:  recent and remote grossly intact  Judgement:  Other:  fair   Insight:  Fair  Psychomotor Activity:  Normal  Concentration:  Concentration: Good and Attention Span: Good  Recall:  Good  Fund of Knowledge:  Good  Language:  Good  Akathisia:  Negative  Handed:  Right  AIMS (if indicated):     Assets:  Communication Skills Desire for Improvement Resilience  ADL's:  Intact  Cognition:  WNL  Sleep:       Treatment Plan Summary: Daily contact with patient to assess and evaluate symptoms and progress in treatment, Medication management, Plan inpatient treatment  and medications as below   Observation Level/Precautions:  15 minute checks  Laboratory:  as needed  TSH  Psychotherapy: milieu, group therapy    Medications:  We discussed options, reports that she has been on Celexa for more than a year and feels it has helped and been well tolerated, she wants to continue this medication. Celexa 20 mgrs QDAY. With regards to Xanax , denies regular use or abuse . Does report taking several on day of admission. Has been started on Librium PRN for possible  BZD WDL if needed .  Consultations:  As needed  Discharge Concerns:  -  Estimated LOS: 2-3 days   Other:     Physician Treatment Plan for Primary Diagnosis:  S/P Overdose  Long Term Goal(s): Improvement in symptoms so as ready for discharge  Short Term Goals: Ability to identify changes in lifestyle to reduce recurrence of condition will improve, Ability to verbalize feelings will improve, Ability to disclose and discuss suicidal ideas, Ability to demonstrate self-control will  improve, Ability to identify and develop effective coping behaviors will improve, Ability to maintain clinical measurements within normal limits will improve and Compliance with prescribed medications will improve  Physician Treatment Plan for Secondary Diagnosis: Consider Panic Disorder, without Agoraphobia Long Term Goal(s): Improvement in symptoms so as ready for discharge  Short Term Goals: Ability to identify changes in lifestyle to reduce recurrence of condition will improve, Ability to verbalize feelings will improve, Ability to disclose and discuss suicidal ideas, Ability to demonstrate self-control will improve, Ability to identify and develop effective coping behaviors will improve, Ability to maintain clinical measurements within normal limits will improve and Compliance with prescribed medications will improve  I certify that inpatient services furnished can reasonably be expected to improve the patient's condition.    Craige CottaFernando A Court Gracia, MD 4/27/20215:11 PM

## 2019-07-08 NOTE — ED Notes (Addendum)
Transport called.

## 2019-07-08 NOTE — ED Notes (Signed)
Report given to Elizabeth

## 2019-07-08 NOTE — Discharge Summary (Signed)
  Patient is to be transferred to [facility] for inpatient psychiatric treatment 

## 2019-07-08 NOTE — [facility] Suicide Risk Assessment (Signed)
[facility] Admission Suicide Risk Assessment   Nursing information obtained from:  Patient Demographic factors:  Adolescent or young adult, Cardell Peach, lesbian, or bisexual orientation Current Mental Status:  NA Loss Factors:  NA Historical Factors:  NA Risk Reduction Factors:  Sense of responsibility to family, Living with another person, especially a relative, Positive social support  Total Time spent with patient: 45 minutes Principal Problem:  S/P Overdose  Diagnosis:  Active Problems:   MDD (major depressive disorder), severe (HCC)  Subjective Data:   Continued Clinical Symptoms:    The "Alcohol Use Disorders Identification Test", Guidelines for Use in Primary Care, Second Edition.  World Science writer [facility]). Score between 0-7:  no or low risk or alcohol related problems. Score between 8-15:  moderate risk of alcohol related problems. Score between 16-19:  high risk of alcohol related problems. Score 20 or above:  warrants further diagnostic evaluation for alcohol dependence and treatment.   CLINICAL FACTORS:  34 year old female, presented to hospital via EMS on 4/26, after overdosing on prescribed Xanax and ibuprofen.  States she took about 5 tablets of 0.5 mg Xanax and about 10 tablets of ibuprofen.  Currently denies suicidal intent, states she was experiencing a panic attack and significant headache at the time and was tried to obtain relief.  Initial ED notes do note that patient had made a statement of wanting to die.  Currently patient minimizes depression, does describe some relatively mild neurovegetative symptoms and he denies pervasive sadness, anhedonia, or any suicidal ideations leading up to event.  She describes a history of depression and anxiety for which she has been prescribed Celexa for about 2 years with good results and good tolerance.  She is also prescribed Xanax at 0.5 mg daily which she states she takes occasionally, currently denying BZD abuse or  misuse.   Psychiatric Specialty Exam: Physical Exam  Review of Systems  Blood pressure (!) 139/105, pulse 84, temperature 99.3 F (37.4 C), resp. rate 18, last menstrual period 06/16/2019, SpO2 98 %.There is no height or weight on file to calculate BMI.  See admit note [facility]   COGNITIVE FEATURES THAT CONTRIBUTE TO RISK:  Closed-mindedness and Loss of executive function    SUICIDE RISK:   Moderate:  Frequent suicidal ideation with limited intensity, and duration, some specificity in terms of plans, no associated intent, good self-control, limited dysphoria/symptomatology, some risk factors present, and identifiable protective factors, including available and accessible social support.  PLAN OF CARE: Patient will be admitted to inpatient psychiatric unit for stabilization and safety. Will provide and encourage milieu participation. Provide medication management and maked adjustments as needed.  Will follow daily.    I certify that inpatient services furnished can reasonably be expected to improve the patient's condition.   Craige Cotta, MD 07/08/2019, 5:52 PM

## 2019-07-08 NOTE — BH Assessment (Addendum)
Assessment Note  Jodi Cochran is an 34 y.o. female. She presents to [facility], via EMS from her job. States that her supervisor called 911 after she informed them that she overdosed. States she consumed #5 Xanax and a "handfull of Ibuprofen". 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 She is a Emergency planning/management officer at[employer]  States that yesterday she felt overwhelmed. Writer asked patient her intentions when she overdosed and she stated, "I just wanted to escape and go to sleep". Per ED notes, "Patient has stated to multiple people that she took the medication "to just go to sleep," but has told this RN and 2 other RN's that she wanted to die and that she felt everything was all on her and on her shoulders.". Patient stating that she has a lot of stress at this time. She explains that her mother became ill and she had to become her caregiver. Additional stressors: "Taking care of my daughter, tying to work, taking care of my household, my grandmother is now ill, etc." Patient states that she is overwhelmed. She reports an increase and anxiety and states, "I don't feel like myself and I haven't been thinking like I should". She reports prior suicidal ideations in the past when she started Zoloft but states those thoughts went away. Patent with worsening anxiety over the past several months. She reports panic attacks described as "crying spells". She denies self mutilating behaviors. She has a family history of depression and anxiety-grandmother and mother. Her daughter also suffers from anxiety.  Patient denies HI. She denies a history of assaultive and/or aggressive behaviors. Patient denies AVH's. She does not appear to be responding to internal stimuli. Patient denies drug use. She does drink occasionally but not that often. Patient dose not have a history of inpatient treatment. She had a psychiatrist in [place] [facility] but stopped going because she didn't feel as if she needed  to go anymore. Patient also saw a provider in [place] but doesn't recall the name. States that she didn't feel like driving the distance so she stopped going.   Patient states that she sleeps all the time to escape her depression. She occasionally drinks alcohol. No drug use.   She indicates that her appetite is poor. She is oriented to time, person, place, and situation. Speech is normal. Affect is depressed and sad. Mood is depressed. Impulse control is poor. Judgement and insight is poor. Patient is dressed in scrubs.     Diagnosis: Depressive Disorder and Anxiety Disorder  Past Medical History:  Past Medical History:  Diagnosis Date  . Anemia   . Anxiety   . Depression   . GERD (gastroesophageal reflux disease)   . HPV in female   . IBS (irritable bowel syndrome)   . Vitamin D deficiency 03/08/2018    Past Surgical History:  Procedure Laterality Date  . ESOPHAGOGASTRODUODENOSCOPY ENDOSCOPY    . NO PAST SURGERIES    . ORIF CLAVICULAR FRACTURE Left 12/12/2018   Procedure: OPEN REDUCTION INTERNAL FIXATION (ORIF) CLAVICULAR FRACTURE;  Surgeon: Jones Broom, MD;  Location: [facility];  Service: Orthopedics;  Laterality: Left;    Family History:  Family History  Problem Relation Age of Onset  . Hypertension Mother   . Depression Mother   . Hypertension Maternal Grandmother   . Diabetes Maternal Grandmother   . Glaucoma Maternal Grandmother   . Thyroid disease Maternal Grandmother   . Depression Maternal Grandmother   . Glaucoma Paternal Grandmother   .  Osteoarthritis Paternal Grandmother   . Bone cancer Paternal Grandmother     Social History:  reports that she has been smoking cigars. She has been smoking about 0.25 packs per day. She has never used smokeless tobacco. She reports current alcohol use. She reports previous drug use.  Additional Social History:  Alcohol / Drug Use Pain Medications: See patient record Prescriptions: See patient record Over the Counter:  See patient record History of alcohol / drug use?: Yes Substance #1 Name of Substance 1: Alcohol 1 - Age of First Use: 21 1 - Amount (size/oz): "I don't drink alot at all" 1 - Frequency: "Social drinker only"; "I rarely drink" 1 - Duration: on-going 1 - Last Use / Amount: "It's bee a few months"  CIWA: CIWA-Ar BP: 119/79 Pulse Rate: (!) 58 COWS:    Allergies: No Known Allergies  Home Medications: (Not in a hospital admission)   OB/GYN Status:  Patient's last menstrual period was 06/16/2019.  General Assessment Data TTS Assessment: In system Is this a Tele or Face-to-Face Assessment?: Face-to-Face Is this an Initial Assessment or a Re-assessment for this encounter?: Initial Assessment Patient Accompanied by:: (EMS) Language Other than English: No Living Arrangements: Other (Comment)(daughter in the household ) What gender do you identify as?: Female Marital status: Single Maiden name: Tresa Endo ) Pregnancy Status: No Living Arrangements: Children Can pt return to current living arrangement?: Yes Admission Status: Voluntary Is patient capable of signing voluntary admission?: Yes Referral Source: (employer ) Insurance type: (Medicaid)     Crisis Care Plan Living Arrangements: Children Name of Psychiatrist: ([facility] [place]past ) Name of Therapist: (no therapist )  Education Status Is patient currently in school?: No Is the patient employed, unemployed or receiving disability?: 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 : No Suicidal Intent: Yes-Currently Present Has patient had any suicidal intent within the past 6 months prior to admission? : Yes Is patient at risk for suicide?: Yes Suicidal Plan?: Yes-Currently Present Has patient had any suicidal plan  within the past 6 months prior to admission? : Yes(Xanax-#5 and a half; Ibuprofen-"handful") Specify Current Suicidal Plan: (overdosed-) Access to Means: Yes Specify Access to Suicidal Means: (medications) Previous Attempts/Gestures: No How many times?: (0) Other Self Harm Risks: (patient denies ) Triggers for Past Attempts: (no ) Intentional Self Injurious Behavior: None Family Suicide History: Yes(Mom/grandmother Landscape architect and anxiety; Daughter-) Recent stressful life event(s): (Mom sick a few months ago; became care taker, gma sick) Persecutory voices/beliefs?: No Depression: Yes Depression Symptoms: Feeling worthless/self pity, Loss of interest in usual pleasures, Guilt, Isolating Substance abuse history and/or treatment for substance abuse?: No Suicide prevention information given to non-admitted patients: Not applicable  Risk to Others within the past 6 months Homicidal Ideation: No Does patient have any lifetime risk of violence toward others beyond the six months prior to admission? : No Thoughts of Harm to Others: No Current Homicidal Intent: No Current Homicidal Plan: No Access to Homicidal Means: No Identified Victim: (n/a) History of harm to others?: No Assessment of Violence: None Noted Violent Behavior Description: (currently calm and cooperative ) Does patient have access to weapons?: No Criminal Charges Pending?: No Does patient have a court date: No Is patient on probation?: No  Psychosis Hallucinations: None noted Delusions: None noted  Mental Status Report Appearance/Hygiene: [employer]  Eye Contact: Good Motor Activity: Freedom of movement Speech: Logical/coherent Level of Consciousness: Alert Mood: Depressed Affect: Appropriate to circumstance Anxiety Level: Panic Attacks Panic attack frequency: ("I had one yesterday at work") Most recent panic attack: (07/07/2019) Thought Processes: Relevant, Coherent Judgement: Unimpaired Orientation:  Person, Place, Time, Situation Obsessive Compulsive Thoughts/Behaviors: None  Cognitive Functioning Concentration: Decreased Memory: Recent Intact, Remote Intact Is patient IDD: No Insight: Fair Impulse Control: Poor Appetite: Poor Have you had any weight changes? : No Change Sleep: Increased Total Hours of Sleep: ("I sleep all the time") Vegetative Symptoms: None  ADLScreening [facility] Assessment Services) Patient's cognitive ability adequate to safely complete daily activities?: Yes Patient able to express need for assistance with ADLs?: Yes Independently performs ADLs?: Yes (appropriate for developmental age)  Prior Inpatient Therapy Prior Inpatient Therapy: No  Prior Outpatient Therapy Prior Outpatient Therapy: Yes Prior Therapy Dates: (past ) Prior Therapy Facilty/Provider(s): ([facility] [place] Reason for Treatment: (med management ) Does patient have an ACCT team?: No Does patient have Intensive In-House Services?  : No Does patient have [facility] services? : No  ADL Screening (condition at time of admission) Patient's cognitive ability adequate to safely complete daily activities?: Yes Is the patient deaf or have difficulty hearing?: No Does the patient have difficulty seeing, even when wearing glasses/contacts?: No Does the patient have difficulty concentrating, remembering, or making decisions?: No Patient able to express need for assistance with ADLs?: Yes Does the patient have difficulty dressing or bathing?: No Independently performs ADLs?: Yes (appropriate for developmental age) Does the patient have difficulty walking or climbing stairs?: No Weakness of Legs: None Weakness of Arms/Hands: None  Home Assistive Devices/Equipment Home Assistive Devices/Equipment: None  Therapy Consults (therapy consults require a physician order) PT Evaluation Needed: No OT Evalulation Needed: No SLP Evaluation Needed: No Abuse/Neglect Assessment  (Assessment to be complete while patient is alone) Physical Abuse: Denies Verbal Abuse: Denies Sexual Abuse: Denies Exploitation of patient/patient's resources: Denies Self-Neglect: Denies   Consults Spiritual Care Consult Needed: No Transition of Care Team Consult Needed: No Advance Directives (For Healthcare) Does Patient Have a Medical Advance Directive?: No Would patient like information on creating a medical advance directive?: No - Patient declined Nutrition Screen- [facility] Patient's home diet: Regular        Disposition: Per Shuvon Rankin, NP, patient to be admitted for inpatient psychiatric stabilization. Patient does meet IVC criteria. Patient to be IVC'd if she doesn't agreed to sign herself into the hospital voluntarily.  Disposition Initial Assessment Completed for this Encounter: Yes  On Site Evaluation by:   Reviewed with Physician:    Waldon Merl 07/08/2019 9:36 AM

## 2019-07-08 NOTE — ED Provider Notes (Signed)
Emergency Medicine Observation Re-evaluation Note  Jodi Cochran is a 34 y.o. female, seen on rounds today.  Pt initially presented to the ED for complaints of Drug Overdose Currently, the patient is awaiting psychiatric placement.  Physical Exam  BP 119/79 (BP Location: Right Arm)   Pulse (!) 58   Temp 98.8 F (37.1 C) (Oral)   Resp 16   Ht 1.803 m (5\' 11" )   Wt 77.1 kg   LMP 06/16/2019   SpO2 100%   BMI 23.71 kg/m  Physical Exam  ED Course / MDM  EKG:EKG Interpretation  Date/Time:  Monday July 07 2019 11:53:54 EDT Ventricular Rate:  61 PR Interval:    QRS Duration: 89 QT Interval:  375 QTC Calculation: 378 R Axis:   62 Text Interpretation: Sinus rhythm Normal ECG No old tracing to compare Confirmed by 12-03-1987 (Dione Booze) on 07/08/2019 4:16:12 AM  Clinical Course as of Jul 08 1135  Mon Jul 07, 2019  1129 Patient with intentional overdose of ativan, ?motrin or tylenol today due to feeling overwhelmed. Currently voluntary. Spoke with poison control who suggest activated charcoal, monitor for 6 hours from ingestion and recheck 4 hour labs and tylenol level. Will need IVC if she wants to leave.    [KM]  1413 If repeat labs normal then patient can be medically cleared for psych eval at 1600   [KM]  1508 Patient care signed out to 1130 PA due to change of shift for f/u on labs and consult TTS when medically cleared.    [KM]    Clinical Course User Index [KM] Sumaiyah, Markert, PA-C   I have reviewed the labs performed to date as well as medications administered while in observation.  Recent changes in the last 24 hours include resting comfortably after. Plan  Current plan is for placement. Patient is under full IVC at this time.   Arlyn Dunning, MD 07/08/19 1137

## 2019-07-08 NOTE — Tx Team (Signed)
Initial Treatment Plan 07/08/2019 5:12 PM [name] OIL:[id number]    PATIENT STRESSORS: Other: Pt unable to identify   PATIENT STRENGTHS: Average or above average intelligence Capable of independent living Communication skills Financial means General fund of knowledge Motivation for treatment/growth Physical Health Supportive family/friends Work skills   PATIENT IDENTIFIED PROBLEMS: depression  anxiety  Suicidal ideation                 DISCHARGE CRITERIA:  Motivation to continue treatment in a less acute level of care Need for constant or close observation no longer present Reduction of life-threatening or endangering symptoms to within safe limits Verbal commitment to aftercare and medication compliance  PRELIMINARY DISCHARGE PLAN: Outpatient therapy Return to previous living arrangement Return to previous work or school arrangements  PATIENT/FAMILY INVOLVEMENT: This treatment plan has been presented to and reviewed with the patient, Jodi Cochran, and/or family member.  The patient and family have been given the opportunity to ask questions and make suggestions.  Hoover Browns, RN 07/08/2019, 5:12 PM

## 2019-07-09 DIAGNOSIS — F322 Major depressive disorder, single episode, severe without psychotic features: Principal | ICD-10-CM

## 2019-07-09 LAB — TSH: TSH: 2.954 u[IU]/mL (ref 0.350–4.500)

## 2019-07-09 NOTE — Progress Notes (Signed)
   07/09/19 2200  Psych Admission Type (Psych Patients Only)  Admission Status Involuntary  Psychosocial Assessment  Patient Complaints None  Eye Contact Brief  Facial Expression Anxious  Affect Appropriate to circumstance  Speech Logical/coherent  Interaction Assertive;Guarded;Forwards little  Motor Activity Other (Comment) (WNL)  Appearance/Hygiene Unremarkable  Behavior Characteristics Cooperative;Anxious  Mood Anxious;Pleasant  Thought Process  Coherency WDL  Content WDL  Delusions None reported or observed  Perception WDL  Hallucination None reported or observed  Judgment WDL  Confusion None  Danger to Self  Current suicidal ideation? Denies  Danger to Others  Danger to Others None reported or observed   Pt pleasant but guarded. Endorses menstrual cramps but refused medication. Pt given heat packs for comfort.

## 2019-07-09 NOTE — Progress Notes (Signed)
Recreation Therapy Notes  Date:  4.28.21 Time: 0930 Location: [address] Dayroom  Group Topic: Stress Management  Goal Area(s) Addresses:  Patient will identify positive stress management techniques. Patient will identify benefits of using stress management post d/c.  Intervention:  Stress Management  Activity:  Guided Imagery.  LRT read a script that guided patients to envision their peaceful place and all the things that make it what it is.  Patients were to listen and follow along as script was read to fully engage in activity.   Education:  Stress Management, Discharge Planning.   Education Outcome: Acknowledges Education  Clinical Observations/Feedback: Pt did not attend activity.     Caroll Rancher, LRT/CTRS         Lillia Abed, Bralon Antkowiak A 07/09/2019 11:13 AM

## 2019-07-09 NOTE — Tx Team (Signed)
Interdisciplinary Treatment and Diagnostic Plan Update  07/09/2019 Time of Session: 9:20am Jodi Cochran MRN: [id number]  Principal Diagnosis: <principal problem not specified>  Secondary Diagnoses: Active Problems:   MDD (major depressive disorder), severe (HCC)   Current Medications:  Current Facility-Administered Medications  Medication Dose Route Frequency Provider Last Rate Last Admin  . acetaminophen (TYLENOL) tablet 650 mg  650 mg Oral Q6H PRN Rankin, Shuvon B, NP      . alum & mag hydroxide-simeth (MAALOX/MYLANTA) 200-200-20 MG/5ML suspension 30 mL  30 mL Oral Q4H PRN Rankin, Shuvon B, NP      . chlordiazePOXIDE (LIBRIUM) capsule 25 mg  25 mg Oral Q6H PRN Rankin, Shuvon B, NP      . citalopram (CELEXA) tablet 20 mg  20 mg Oral Daily Rankin, Shuvon B, NP      . hydrOXYzine (ATARAX/VISTARIL) tablet 25 mg  25 mg Oral Q6H PRN Rankin, Shuvon B, NP      . magnesium hydroxide (MILK OF MAGNESIA) suspension 30 mL  30 mL Oral Daily PRN Rankin, Shuvon B, NP      . multivitamin with minerals tablet 1 tablet  1 tablet Oral Daily Rankin, Shuvon B, NP      . thiamine tablet 100 mg  100 mg Oral Daily Rankin, Shuvon B, NP       PTA Medications: Medications Prior to Admission  Medication Sig Dispense Refill Last Dose  . ALPRAZolam (XANAX) 0.5 MG tablet Take 0.5 mg by mouth at bedtime as needed for sleep.     Marland Kitchen amoxicillin (AMOXIL) 875 MG tablet Take 1 tablet (875 mg total) by mouth 2 (two) times daily. (Patient not taking: Reported on 11/30/2018) 20 tablet 0   . Ascorbic Acid (VITAMIN C PO) Take 1 tablet by mouth daily.     . cetirizine (ZYRTEC) 10 MG tablet Take 1 tablet (10 mg total) by mouth daily. (Patient not taking: Reported on 11/30/2018) 30 tablet 0   . Cholecalciferol (VITAMIN D3 PO) Take 1 capsule by mouth daily.     . citalopram (CELEXA) 20 MG tablet Take 1 tablet (20 mg total) by mouth daily. 30 tablet 0   . dicyclomine (BENTYL) 20 MG tablet Take 1 tablet (20 mg total) by mouth 2  (two) times daily. (Patient not taking: Reported on 03/07/2018) 20 tablet 0   . ELDERBERRY PO Take 1 tablet by mouth daily.     . fluticasone (FLONASE) 50 MCG/ACT nasal spray Place 1 spray into both nostrils daily. (Patient not taking: Reported on 11/30/2018) 16 g 0   . hydrOXYzine (ATARAX/VISTARIL) 25 MG tablet Take 25 mg by mouth daily.     Marland Kitchen ibuprofen (ADVIL) 200 MG tablet Take 400 mg by mouth every 6 (six) hours as needed for headache or moderate pain.     . methocarbamol (ROBAXIN) 500 MG tablet Take 2 tablets (1,000 mg total) by mouth 4 (four) times daily as needed for muscle spasms (muscle spasm/pain). (Patient not taking: Reported on 07/07/2019) 25 tablet 0   . ondansetron (ZOFRAN ODT) 4 MG disintegrating tablet Take 1 tablet (4 mg total) by mouth every 8 (eight) hours as needed for nausea or vomiting. (Patient not taking: Reported on 03/07/2018) 10 tablet 0   . oxyCODONE-acetaminophen (PERCOCET) 5-325 MG tablet Take 1-2 tablets every 4 hours as needed for post operative pain. MAX 6/day (Patient not taking: Reported on 07/07/2019) 30 tablet 0   . Vitamin D, Ergocalciferol, (DRISDOL) 1.25 MG (50000 UT) CAPS capsule Take 1 capsule (50,000 Units  total) by mouth every 7 (seven) days. (Patient not taking: Reported on 11/30/2018) 12 capsule 0     Patient Stressors: Other: Pt unable to identify  Patient Strengths: Average or above average intelligence Capable of independent living Communication skills Financial means General fund of knowledge Motivation for treatment/growth Physical Health Supportive family/friends Work skills  Treatment Modalities: Medication Management, Group therapy, Case management,  1 to 1 session with clinician, Psychoeducation, Recreational therapy.   Physician Treatment Plan for Primary Diagnosis: <principal problem not specified> Long Term Goal(s): Improvement in symptoms so as ready for discharge Improvement in symptoms so as ready for discharge   Short Term  Goals: Ability to identify changes in lifestyle to reduce recurrence of condition will improve Ability to verbalize feelings will improve Ability to disclose and discuss suicidal ideas Ability to demonstrate self-control will improve Ability to identify and develop effective coping behaviors will improve Ability to maintain clinical measurements within normal limits will improve Compliance with prescribed medications will improve Ability to identify changes in lifestyle to reduce recurrence of condition will improve Ability to verbalize feelings will improve Ability to disclose and discuss suicidal ideas Ability to demonstrate self-control will improve Ability to identify and develop effective coping behaviors will improve Ability to maintain clinical measurements within normal limits will improve Compliance with prescribed medications will improve  Medication Management: Evaluate patient's response, side effects, and tolerance of medication regimen.  Therapeutic Interventions: 1 to 1 sessions, Unit Group sessions and Medication administration.  Evaluation of Outcomes: Not Met  Physician Treatment Plan for Secondary Diagnosis: Active Problems:   MDD (major depressive disorder), severe ([place])  Long Term Goal(s): Improvement in symptoms so as ready for discharge Improvement in symptoms so as ready for discharge   Short Term Goals: Ability to identify changes in lifestyle to reduce recurrence of condition will improve Ability to verbalize feelings will improve Ability to disclose and discuss suicidal ideas Ability to demonstrate self-control will improve Ability to identify and develop effective coping behaviors will improve Ability to maintain clinical measurements within normal limits will improve Compliance with prescribed medications will improve Ability to identify changes in lifestyle to reduce recurrence of condition will improve Ability to verbalize feelings will  improve Ability to disclose and discuss suicidal ideas Ability to demonstrate self-control will improve Ability to identify and develop effective coping behaviors will improve Ability to maintain clinical measurements within normal limits will improve Compliance with prescribed medications will improve     Medication Management: Evaluate patient's response, side effects, and tolerance of medication regimen.  Therapeutic Interventions: 1 to 1 sessions, Unit Group sessions and Medication administration.  Evaluation of Outcomes: Not Met   RN Treatment Plan for Primary Diagnosis: <principal problem not specified> Long Term Goal(s): Knowledge of disease and therapeutic regimen to maintain health will improve  Short Term Goals: Ability to participate in decision making will improve, Ability to disclose and discuss suicidal ideas, Ability to identify and develop effective coping behaviors will improve and Compliance with prescribed medications will improve  Medication Management: RN will administer medications as ordered by provider, will assess and evaluate patient's response and provide education to patient for prescribed medication. RN will report any adverse and/or side effects to prescribing provider.  Therapeutic Interventions: 1 on 1 counseling sessions, Psychoeducation, Medication administration, Evaluate responses to treatment, Monitor vital signs and CBGs as ordered, Perform/monitor CIWA, COWS, AIMS and Fall Risk screenings as ordered, Perform wound care treatments as ordered.  Evaluation of Outcomes: Not Met   LCSW Treatment  Plan for Primary Diagnosis: <principal problem not specified> Long Term Goal(s): Safe transition to appropriate next level of care at discharge, Engage patient in therapeutic group addressing interpersonal concerns.  Short Term Goals: Engage patient in aftercare planning with referrals and resources  Therapeutic Interventions: Assess for all discharge needs, 1  to 1 time with Social worker, Explore available resources and support systems, Assess for adequacy in community support network, Educate family and significant other(s) on suicide prevention, Complete Psychosocial Assessment, Interpersonal group therapy.  Evaluation of Outcomes: Not Met   Progress in Treatment: Attending groups: Yes. Participating in groups: Yes. Taking medication as prescribed: Yes. Toleration medication: Yes. Family/Significant other contact made: No, will contact:  if patient consents to collateral contacts Patient understands diagnosis: Yes. Discussing patient identified problems/goals with staff: Yes. Medical problems stabilized or resolved: Yes. Denies suicidal/homicidal ideation: Yes. Issues/concerns per patient self-inventory: No. Other:   New problem(s) identified: None   New Short Term/Long Term Goal(s): medication stabilization, elimination of SI thoughts, development of comprehensive mental wellness plan.    Patient Goals:  "I do not know why I am here. I did not try to commit suicide    Discharge Plan or Barriers: Patient recently admitted. CSW will continue to follow and assess for appropriate referrals and possible discharge planning.    Reason for Continuation of Hospitalization: Anxiety Depression Medication stabilization Suicidal ideation  Estimated Length of Stay: 3-5 days   Attendees: Patient: Jodi Cochran  07/09/2019 9:03 AM  Physician: Dr. Neita Garnet, MD 07/09/2019 9:03 AM  Nursing:  07/09/2019 9:03 AM  RN Care Manager: 07/09/2019 9:03 AM  Social Worker: Radonna Ricker, LCSW 07/09/2019 9:03 AM  Recreational Therapist:  07/09/2019 9:03 AM  Other:  07/09/2019 9:03 AM  Other:  07/09/2019 9:03 AM  Other: 07/09/2019 9:03 AM    Scribe for Treatment Team: Marylee Floras, [place] 07/09/2019 9:03 AM

## 2019-07-09 NOTE — Progress Notes (Signed)
[facility] MD Progress Note  07/09/2019 3:19 PM Jodi Cochran  MRN:  [id number] Subjective: Patient reports "I am feeling all right".  Currently denies feeling depressed.  Denies suicidal ideations and presents future oriented, hoping for discharge soon. Objective: I discussed case with treatment team and met with patient. 34 year old female, presented to hospital via EMS on 4/26, after overdosing on prescribed Xanax and ibuprofen.  States she took about 5 tablets of 0.5 mg Xanax and about 10 tablets of ibuprofen.  Currently denies suicidal intent, states she was experiencing a panic attack and significant headache at the time and was tried to obtain relief.  Initial ED notes do note that patient had made a statement of wanting to die.  Currently patient minimizes depression, does describe some relatively mild neurovegetative symptoms and he denies pervasive sadness, anhedonia, or any suicidal ideations leading up to event.  She describes a history of depression and anxiety for which she has been prescribed Celexa for about 2 years with good results and good tolerance.  She is also prescribed Xanax at 0.5 mg daily which she states she takes occasionally, currently denying BZD abuse or misuse.  Today patient presents alert, attentive, calm, pleasant/cooperative on approach. She reports she is feeling "all right" and at this time denies feeling depressed.  Her affect presents appropriate and reactive.  She denies any suicidal ideations and continues to describe recent overdose as an impulsive attempt to obtain relief from anxiety and headache , without any suicidal or self-injurious intent. Behavior on unit is in good control, no disruptive or agitated behaviors. She is currently future oriented and hopeful for discharge soon, looking forward to return to work. With her expressed consent I spoke with her mother via phone.  Mother corroborates that patient has been doing well and that she does not feel patient has  been depressed leading up to this admission.  Mother is in agreement with discharge soon. She is not presenting with any symptoms of BZD withdrawal.  No tremors, no diaphoresis, no restlessness or agitation.  Her BP readings have trended high-patient reports she has been worked up for hypertension by her PCP in the past as her blood pressure was elevated during visit.  States however that her BPs were within normal during home monitoring and her PCP decided not to start medication. Repeat BP improved at 129/86 at 5 PM.  Principal Problem: Status post overdose Diagnosis: Active Problems:   MDD (major depressive disorder), severe (HCC)  Total Time spent with patient: 15 minutes  Past Psychiatric History:   Past Medical History:  Past Medical History:  Diagnosis Date  . Anemia   . Anxiety   . Depression   . GERD (gastroesophageal reflux disease)   . HPV in female   . IBS (irritable bowel syndrome)   . Vitamin D deficiency 03/08/2018    Past Surgical History:  Procedure Laterality Date  . ESOPHAGOGASTRODUODENOSCOPY ENDOSCOPY    . NO PAST SURGERIES    . ORIF CLAVICULAR FRACTURE Left 12/12/2018   Procedure: OPEN REDUCTION INTERNAL FIXATION (ORIF) CLAVICULAR FRACTURE;  Surgeon: Tania Ade, MD;  Location: [facility];  Service: Orthopedics;  Laterality: Left;   Family History:  Family History  Problem Relation Age of Onset  . Hypertension Mother   . Depression Mother   . Hypertension Maternal Grandmother   . Diabetes Maternal Grandmother   . Glaucoma Maternal Grandmother   . Thyroid disease Maternal Grandmother   . Depression Maternal Grandmother   . Glaucoma Paternal Grandmother   .  Osteoarthritis Paternal Grandmother   . Bone cancer Paternal Grandmother    Family Psychiatric  History:  Social History:  Social History   Substance and Sexual Activity  Alcohol Use Yes  . Alcohol/week: 1.0 standard drinks  . Types: 1 Glasses of wine per week   Comment: qod     Social  History   Substance and Sexual Activity  Drug Use Not Currently    Social History   Socioeconomic History  . Marital status: Single    Spouse name: Not on file  . Number of children: Not on file  . Years of education: Not on file  . Highest education level: Not on file  Occupational History  . Not on file  Tobacco Use  . Smoking status: Current Every Day Smoker    Packs/day: 0.25    Types: Cigars    Last attempt to quit: 07/01/2017    Years since quitting: 2.0  . Smokeless tobacco: Never Used  Substance and Sexual Activity  . Alcohol use: Yes    Alcohol/week: 1.0 standard drinks    Types: 1 Glasses of wine per week    Comment: qod  . Drug use: Not Currently  . Sexual activity: Yes    Partners: Female    Birth control/protection: None  Other Topics Concern  . Not on file  Social History Narrative   Lives with her young daughter.   Works as Conservation officer, historic buildings Strain:   . Difficulty of Paying Living Expenses:   Food Insecurity:   . Worried About Charity fundraiser in the Last Year:   . Arboriculturist in the Last Year:   Transportation Needs:   . Film/video editor (Medical):   Marland Kitchen Lack of Transportation (Non-Medical):   Physical Activity:   . Days of Exercise per Week:   . Minutes of Exercise per Session:   Stress:   . Feeling of Stress :   Social Connections:   . Frequency of Communication with Friends and Family:   . Frequency of Social Gatherings with Friends and Family:   . Attends Religious Services:   . Active Member of Clubs or Organizations:   . Attends Archivist Meetings:   Marland Kitchen Marital Status:    Additional Social History:   Sleep: Good  Appetite:  Good  Current Medications: Current Facility-Administered Medications  Medication Dose Route Frequency Provider Last Rate Last Admin  . acetaminophen (TYLENOL) tablet 650 mg  650 mg Oral Q6H PRN Rankin, Shuvon B, NP      . alum & mag  hydroxide-simeth (MAALOX/MYLANTA) 200-200-20 MG/5ML suspension 30 mL  30 mL Oral Q4H PRN Rankin, Shuvon B, NP      . chlordiazePOXIDE (LIBRIUM) capsule 25 mg  25 mg Oral Q6H PRN Rankin, Shuvon B, NP      . citalopram (CELEXA) tablet 20 mg  20 mg Oral Daily Rankin, Shuvon B, NP      . hydrOXYzine (ATARAX/VISTARIL) tablet 25 mg  25 mg Oral Q6H PRN Rankin, Shuvon B, NP      . magnesium hydroxide (MILK OF MAGNESIA) suspension 30 mL  30 mL Oral Daily PRN Rankin, Shuvon B, NP      . multivitamin with minerals tablet 1 tablet  1 tablet Oral Daily Rankin, Shuvon B, NP      . thiamine tablet 100 mg  100 mg Oral Daily Rankin, Shuvon B, NP  Lab Results:  Results for orders placed or performed during the hospital encounter of 07/08/19 (from the past 48 hour(s))  TSH     Status: None   Collection Time: 07/09/19  6:40 AM  Result Value Ref Range   TSH 2.954 0.350 - 4.500 uIU/mL    Comment: Performed by a 3rd Generation assay with a functional sensitivity of <=0.01 uIU/mL. Performed at [facility], [place] [address] [place], [place] [address]     Blood Alcohol level:  Lab Results  Component Value Date   ETH <10 62/37/6283    Metabolic Disorder Labs: Lab Results  Component Value Date   HGBA1C 5.5 03/07/2018   No results found for: PROLACTIN Lab Results  Component Value Date   CHOL 192 01/30/2017   TRIG 68 01/30/2017   HDL 64 01/30/2017   CHOLHDL 3.0 01/30/2017   LDLCALC 113 (H) 01/30/2017    Physical Findings: AIMS: Facial and Oral Movements Muscles of Facial Expression: None, normal Lips and Perioral Area: None, normal Jaw: None, normal Tongue: None, normal,Extremity Movements Upper (arms, wrists, hands, fingers): None, normal Lower (legs, knees, ankles, toes): None, normal, Trunk Movements Neck, shoulders, hips: None, normal, Overall Severity Severity of abnormal movements (highest score from questions above): None, normal Incapacitation due to abnormal  movements: None, normal Patient's awareness of abnormal movements (rate only patient's report): No Awareness, Dental Status Current problems with teeth and/or dentures?: No Does patient usually wear dentures?: No  CIWA:  CIWA-Ar Total: 4 COWS:     Musculoskeletal: Strength & Muscle Tone: within normal limits no tremors, no diaphoresis, no restlessness or agitation Gait & Station: normal Patient leans: N/A  Psychiatric Specialty Exam: Physical Exam  Review of Systems denies headache, denies visual disturbances, denies chest pain or shortness of breath, no nausea, no vomiting  Blood pressure (!) 149/102, pulse 92, temperature 99.3 F (37.4 C), resp. rate 18, last menstrual period 06/16/2019, SpO2 98 %.There is no height or weight on file to calculate BMI.  General Appearance: Well Groomed  Eye Contact:  Good  Speech:  Normal Rate  Volume:  Normal  Mood:  Denies feeling depressed, reports her mood as "all right"  Affect:  Appropriate, reactive  Thought Process:  Linear and Descriptions of Associations: Intact  Orientation:  Full (Time, Place, and Person)  Thought Content:  No hallucinations, no delusions  Suicidal Thoughts:  No denies suicidal or self-injurious ideations, denies homicidal or violent ideations  Homicidal Thoughts:  No  Memory:  Recent and remote grossly intact  Judgement:  Other:  Improving  Insight:  Fair/improving  Psychomotor Activity:  Within normal-no psychomotor agitation or restlessness noted at this time.  No tremors  Concentration:  Concentration: Good and Attention Span: Good  Recall:  Good  Fund of Knowledge:  Good  Language:  Good  Akathisia:  Negative  Handed:  Right  AIMS (if indicated):     Assets:  Communication Skills Desire for Improvement Resilience  ADL's:  Intact  Cognition:  WNL  Sleep:  Number of Hours: 6.75   Assessment: 34 year old female, presented to hospital via EMS on 4/26, after overdosing on prescribed Xanax and ibuprofen.   States she took about 5 tablets of 0.5 mg Xanax and about 10 tablets of ibuprofen.  Currently denies suicidal intent, states she was experiencing a panic attack and significant headache at the time and was tried to obtain relief.  Initial ED notes do note that patient had made a statement of wanting to die.  Currently patient minimizes depression, does describe some relatively mild neurovegetative symptoms and he denies pervasive sadness, anhedonia, or any suicidal ideations leading up to event.  She describes a history of depression and anxiety for which she has been prescribed Celexa for about 2 years with good results and good tolerance.  She is also prescribed Xanax at 0.5 mg daily which she states she takes occasionally, currently denying BZD abuse or misuse.  Today patient denies feeling depressed and describes her mood as "all right".  Her affect is reactive.  She denies suicidal ideations and denies recent overdose was suicidal or self-injurious intent.  Currently presents future oriented and focused on being discharged soon.  She is not presenting with any symptoms of benzodiazepine withdrawal and had reported taking Xanax only occasionally/denied any pattern of abuse.  She is tolerating Celexa well without side effects.   Treatment Plan Summary: Daily contact with patient to assess and evaluate symptoms and progress in treatment, Medication management, Plan Inpatient treatment and Medications as below Encourage group and milieu participation Continue Celexa 20 mg daily for depression/anxiety Continue Librium as needed for BZD withdrawal if needed Continue Vistaril 25 mg every 6 hours as needed for anxiety if needed Treatment team working on disposition planning options Jenne Campus, MD 07/09/2019, 3:19 PM

## 2019-07-09 NOTE — [facility] Group Notes (Signed)
LCSW Group Therapy Note 07/09/2019 11:46 AM  Type of Therapy/Topic: Group Therapy: Emotion Regulation  Participation Level: Minimal   Description of Group:  The purpose of this group is to assist patients in learning to regulate negative emotions and experience positive emotions. Patients will be guided to discuss ways in which they have been vulnerable to their negative emotions. These vulnerabilities will be juxtaposed with experiences of positive emotions or situations, and patients will be challenged to use positive emotions to combat negative ones. Special emphasis will be placed on coping with negative emotions in conflict situations, and patients will process healthy conflict resolution skills.  Therapeutic Goals: 1. Patient will identify two positive emotions or experiences to reflect on in order to balance out negative emotions 2. Patient will label two or more emotions that they find the most difficult to experience 3. Patient will demonstrate positive conflict resolution skills through discussion and/or role plays  Summary of Patient Progress:   Jodi Cochran appeared to be engaged during the second half of the group's discussion. She did not contribute to the group's discussion. CSW provided a worksheet packet that provides comprehensive psycho-educational skills that challenges patients to confront negative thoughts and emotions by providing discussion points and self reflective questions.CSW reviewed and processed theworkbook with the group and allotted opportunities for patients to express their answers and thoughts after completing the packet.   Therapeutic Modalities:  Cognitive Behavioral Therapy Feelings Identification Dialectical Behavioral Therapy   Jodi Cochran Clinical Social Worker

## 2019-07-09 NOTE — Progress Notes (Signed)
   07/08/19 2015  Psych Admission Type (Psych Patients Only)  Admission Status Involuntary  Psychosocial Assessment  Patient Complaints None  Eye Contact Brief  Facial Expression Anxious  Affect Anxious  Speech Logical/coherent  Interaction Assertive;Guarded;Forwards little  Motor Activity Other (Comment) (WNL)  Appearance/Hygiene Unremarkable  Behavior Characteristics Cooperative;Anxious  Mood Anxious  Thought Process  Coherency WDL  Content WDL  Delusions None reported or observed  Perception WDL  Hallucination None reported or observed  Judgment WDL  Confusion None  Danger to Self  Current suicidal ideation? Denies  Danger to Others  Danger to Others None reported or observed

## 2019-07-09 NOTE — [facility] Counselor (Signed)
Adult Comprehensive Assessment  Patient ID: Burma Ketcher, female   DOB: April 30, 1985, 34 y.o.   MRN: [id number]  Information Source: Information source: Patient  Current Stressors:  Patient states their primary concerns and needs for treatment are:: "I had an axiety attack and took ibuprofen and xanax. 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 Has a 8 year old daughter and reports her mother, grandmother, and aunt are very involved. Her father drove from [place] once he learned she was admitted to [facility] / Lack of resources (include bankruptcy): Has income and Medicaid Housing / Lack of housing: Denies Physical health (include injuries & life threatening diseases): Concerned she might have thyroid issues, but has had lab work done at [facility]: "I have good friends, lots of them are work friends." Has been dating another woman for 8 months and things are going well Substance abuse: Denies Bereavement / Loss: Denies  Living/Environment/Situation:  Living Arrangements: Children Living conditions (as described by patient or guardian): Single family home in [place] Who else lives in the home?: 23 year old daughter How long has patient lived in current situation?: "A while." What is atmosphere in current home: Comfortable, Supportive, Loving  Family History:  Marital status: Long term relationship Long term relationship, how long?: 8 months What types of issues is patient dealing with in the relationship?: Denies stressors Are you sexually active?: Yes What is your sexual orientation?: Bisexual Has your sexual activity been affected by drugs, alcohol, medication, or emotional stress?: Denies Does patient  have children?: Yes How many children?: 1 How is patient's relationship with their children?: Daughter, good relationship. Mother is currently caring for her daughter.  Childhood History:  By whom was/is the patient raised?: Mother, Father Additional childhood history information: Parents are separated but co-parented well and remained friends. Description of patient's relationship with caregiver when they were a child: Great Patient's description of current relationship with people who raised him/her: [place], both parents are supportive and involved. Mother lives locally, father lives in [place] How were you disciplined when you got in trouble as a child/adolescent?: Appropriate discipline Does patient have siblings?: Yes Number of Siblings: 1 Description of patient's current relationship with siblings: Paternal half sister, good relationship Did patient suffer any verbal/emotional/physical/sexual abuse as a child?: No Did patient suffer from severe childhood neglect?: No Has patient ever been sexually abused/assaulted/raped as an adolescent or adult?: No Was the patient ever a victim of a crime or a disaster?: No Witnessed domestic violence?: No Has patient been effected by domestic violence as an adult?: No  Education:  Highest grade of school patient has completed: Buyer, retail in Early Childhood Education Currently a student?: No Learning disability?: No  Employment/Work Situation:   Employment situation: Employed 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 How long has patient been employed?: Has worked in early childhood education for 13 years Patient's job has been impacted by current illness: No What is the longest time patient has a held a job?: Current Where was the patient employed at that time?: Current Did You Receive Any Psychiatric Treatment/Services While in Passenger transport manager?: No Are There Guns or Other Weapons in [place]?: No  Financial Resources:   Financial  resources: Income from employment, Medicaid Does patient have a representative payee or guardian?: No  Alcohol/Substance Abuse:   What has been your use of drugs/alcohol within the last 12 months?: Denies Alcohol/Substance Abuse Treatment Hx: Past Tx, Outpatient If yes, describe treatment: Has been to a counselor in the past (3 years ago) through [facility]. Gets medications from her PCP Has alcohol/substance abuse ever caused legal problems?: No  Social Support System:   Forensic psychologist System: Production assistant, radio System: Girlfriend, mother, aunt, grandmother, father, friends, coworkers Type of faith/religion: Chrisitian How does patient's faith help to cope with current illness?: Prayer, faith  Leisure/Recreation:   Leisure and Hobbies: Singing, dancing, playing basketball, hanging out with friends.  Strengths/Needs:   What is the patient's perception of their strengths?: Effective communicator Patient states they can use these personal strengths during their treatment to contribute to their recovery: Yes Patient states these barriers may affect/interfere with their treatment: Denies Patient states these barriers may affect their return to the community: Denies Other important information patient would like considered in planning for their treatment: None  Discharge Plan:   Currently receiving community mental health services: No Patient states concerns and preferences for aftercare planning are: Receives medication management from her PCP at [facility] in [place]. Declines therapy referrals but is agreeable to being provided resources. Patient states they will know when they are safe and ready for discharge when: Feels ready now Does patient have access to transportation?: Yes Does patient have financial barriers related to discharge medications?: No Patient description of barriers related to discharge medications: Has income and Medicaid. Will  patient be returning to same living situation after discharge?: Yes  Summary/Recommendations:   Summary and Recommendations (to be completed by the evaluator): Alle is a 34 year old female from [facility]), she presents to [facility] under IVC from [place], via EMS from her job. States that her supervisor called 911 after she informed them that she overdosed. States she consumed 5 Xanax and a "handfull of Ibuprofen." Patient reports she took the medication because she was experiencing an anxiety attack, denies SI. Patient reports she PCP prescribes her mental health medications. While here, Ryenne can benefit from crisis stabilization, medication management, therapeutic milieu, and referrals for services.  Darreld Mclean. 07/09/2019

## 2019-07-09 NOTE — Progress Notes (Signed)
D. Pt presents as tearful, depressed- isolative to room- voices little insight as to why she is here "against her will"- Pt refused her medication, stating, "I don't want to take anything while I'm here". Pt states, "I am not suicidal. I have a daughter." Per pt's self inventory, pt rated her depression, hopelessness and anxiety all 0's. Pt writes that her goal today is "going home to be with my daughter and family". Pt writes that she will "pray and remain positive" to help her meet that goal A. Labs and vitals monitored.Pt supported emotionally and encouraged to express concerns and ask questions.   R. Pt remains safe with 15 minute checks. Will continue POC.

## 2019-07-10 DIAGNOSIS — T50901A Poisoning by unspecified drugs, medicaments and biological substances, accidental (unintentional), initial encounter: Secondary | ICD-10-CM

## 2019-07-10 DIAGNOSIS — T424X1A Poisoning by benzodiazepines, accidental (unintentional), initial encounter: Secondary | ICD-10-CM

## 2019-07-10 MED ORDER — CITALOPRAM HYDROBROMIDE 20 MG PO TABS: 20.0000 mg | ORAL_TABLET | Freq: Every day | ORAL | 0 refills | Status: AC

## 2019-07-10 MED ORDER — HYDROXYZINE HCL 25 MG PO TABS: 25.0000 mg | ORAL_TABLET | Freq: Four times a day (QID) | ORAL | 0 refills | Status: AC | PRN

## 2019-07-10 NOTE — Discharge Summary (Addendum)
Physician Discharge Summary Note  Patient:  Jodi Cochran is an 34 y.o., female  MRN:  [id number]  DOB:  19-Jan-1986  Patient phone:  [phone] (home)   Patient address:   [address]  [place] [address],   Total Time spent with patient: Greater than 30 minutes  Date of Admission:  07/08/2019  Date of Discharge: 07-10-19  Reason for Admission: Suicidal ideations & overdose on antianxiety medications,  Principal Problem: MDD (major depressive disorder), severe (HCC)  Discharge Diagnoses: Principal Problem:   MDD (major depressive disorder), severe (HCC) Active Problems:   Drug overdose  Past Psychiatric History: Major depressive disorder, severe.  Past Medical History:  Past Medical History:  Diagnosis Date  . Anemia   . Anxiety   . Depression   . GERD (gastroesophageal reflux disease)   . HPV in female   . IBS (irritable bowel syndrome)   . Vitamin D deficiency 03/08/2018    Past Surgical History:  Procedure Laterality Date  . ESOPHAGOGASTRODUODENOSCOPY ENDOSCOPY    . NO PAST SURGERIES    . ORIF CLAVICULAR FRACTURE Left 12/12/2018   Procedure: OPEN REDUCTION INTERNAL FIXATION (ORIF) CLAVICULAR FRACTURE;  Surgeon: Jones Broom, MD;  Location: [facility];  Service: Orthopedics;  Laterality: Left;   Family History:  Family History  Problem Relation Age of Onset  . Hypertension Mother   . Depression Mother   . Hypertension Maternal Grandmother   . Diabetes Maternal Grandmother   . Glaucoma Maternal Grandmother   . Thyroid disease Maternal Grandmother   . Depression Maternal Grandmother   . Glaucoma Paternal Grandmother   . Osteoarthritis Paternal Grandmother   . Bone cancer Paternal Grandmother    Family Psychiatric  History: See H&P.  Social History:  Social History   Substance and Sexual Activity  Alcohol Use Yes  . Alcohol/week: 1.0 standard drinks  . Types: 1 Glasses of wine per week   Comment: qod     Social History   Substance  and Sexual Activity  Drug Use Not Currently    Social History   Socioeconomic History  . Marital status: Single    Spouse name: Not on file  . Number of children: Not on file  . Years of education: Not on file  . Highest education level: Not on file  Occupational History  . Not on file  Tobacco Use  . Smoking status: Current Every Day Smoker    Packs/day: 0.25    Types: Cigars    Last attempt to quit: 07/01/2017    Years since quitting: 2.0  . Smokeless tobacco: Never Used  Substance and Sexual Activity  . Alcohol use: Yes    Alcohol/week: 1.0 standard drinks    Types: 1 Glasses of wine per week    Comment: qod  . Drug use: Not Currently  . Sexual activity: Yes    Partners: Female    Birth control/protection: None  Other Topics Concern  . Not on file  Social History Narrative   Lives with her young daughter.   Works as Naval architect Strain:   . Difficulty of Paying Living Expenses:   Food Insecurity:   . Worried About Programme researcher, broadcasting/film/video in the Last Year:   . Barista in the Last Year:   Transportation Needs:   . Freight forwarder (Medical):   Marland Kitchen Lack of Transportation (Non-Medical):   Physical Activity:   . Days of Exercise per  Week:   . Minutes of Exercise per Session:   Stress:   . Feeling of Stress :   Social Connections:   . Frequency of Communication with Friends and Family:   . Frequency of Social Gatherings with Friends and Family:   . Attends Religious Services:   . Active Member of Clubs or Organizations:   . Attends Banker Meetings:   Marland Kitchen Marital Status:    Hospital Course: (Per Md's admission evaluation notes): 66 y old female, presented to ED on 4/26 via EMS. Explains she was at work and had a significant panic attack. Reports she took about 5 ( 0.5 mgrs) of Xanax , which she is prescribed , and about 10 tablets of Ibuprofen. States she then told her employer, who called  911. Currently patient denies any suicidal intent, and states that at the time she was experiencing a severe panic attack  and also a significant headache. Describes having palpitations," shaking", crying , feeling short of breath. As per ED chart notes she had  made statement of wanting to die. Currently denies suicidal intent and states, " I was not trying to die, I was just trying to get some relief". She is unsure what may have triggered the panic attack, and states " maybe I drank too much caffeine that morning". She reports a history of depression and anxiety, but states that in general she has been doing better on Celexa. She denies having had any suicidal ideations leading up to event/admission. Currently minimizes depression and states " I think my mood has been OK". She does endorse some neuro-vegetative symptoms as below, but denies anhedonia or pervasive sense of sadness.  After the above admission evaluation, patient's presenting symptoms were noted. She was recommended for mood stabilization treatments. The medication regimen targeting those presenting symptoms were discussed with her & initiated with her consent. She was medicated, stabilized & discharged on the medications as listed on her discharge medication lists below. Besides the mood stabilization treatments, Jodi Cochran was also enrolled & participated in the group counseling sessions being offered & held on this unit. She learned coping skills. She also presented other significant pre-existing medical issues that required treatment. She was resumed & discharged on all her pertinent home medications for those health issues. She tolerated her treatment regimen without any adverse effects or reactions reported.  During the course of her hospitalization, the 15-minute checks were adequate to ensure Jodi Cochran's safety.  Patient did not display any dangerous, violent or suicidal behavior on the unit.  She interacted with patients & staff  appropriately, participated appropriately in the group sessions/therapies. Her medications were addressed & adjusted to meet her needs. She was recommended for outpatient follow-up care & medication management upon discharge to assure continuity of care.  At the time of discharge today, patient is not reporting any acute suicidal/homicidal ideations. She feels more confident about her self-care & in managing the suicidal/homicidal thoughts. She currently denies any new issues or concerns. Education and supportive counseling provided throughout her hospital stay & upon discharge.  Today upon her discharge evaluation with the attending psychiatrist, Jodi Cochran shares she is doing well. She denies any other specific concerns. She is sleeping well. Her appetite is good. She denies other physical complaints. She denies AH/VH. She feels that her medications have been helpful & is in agreement to continue her current treatment regimen. She was able to engage in safety planning including plan to return to [facility] or contact emergency services if she feels  unable to maintain her own safety or the safety of others. Pt had no further questions, comments, or concerns. She left [facility] with all personal belongings in no apparent distress. Transportation per family (cousin).  Physical Findings: AIMS: Facial and Oral Movements Muscles of Facial Expression: None, normal Lips and Perioral Area: None, normal Jaw: None, normal Tongue: None, normal,Extremity Movements Upper (arms, wrists, hands, fingers): None, normal Lower (legs, knees, ankles, toes): None, normal, Trunk Movements Neck, shoulders, hips: None, normal, Overall Severity Severity of abnormal movements (highest score from questions above): None, normal Incapacitation due to abnormal movements: None, normal Patient's awareness of abnormal movements (rate only patient's report): No Awareness, Dental Status Current problems with teeth and/or dentures?: No Does patient  usually wear dentures?: No  CIWA:  CIWA-Ar Total: 0 COWS:     Musculoskeletal: Strength & Muscle Tone: within normal limits Gait & Station: normal Patient leans: N/A  Psychiatric Specialty Exam: Physical Exam  Nursing note and vitals reviewed. Constitutional: She is oriented to person, place, and time. She appears well-developed.  Eyes: Pupils are equal, round, and reactive to light.  Cardiovascular: Normal rate.  Respiratory: Effort normal and breath sounds normal.  Genitourinary:    Genitourinary Comments: Deferred   Musculoskeletal:        General: Normal range of motion.     Cervical back: Normal range of motion.  Neurological: She is alert and oriented to person, place, and time.  Skin: Skin is warm and dry.    Review of Systems  Constitutional: Negative for chills, diaphoresis and fever.  HENT: Negative for congestion, rhinorrhea, sneezing and sore throat.   Eyes: Negative for discharge.  Respiratory: Negative for cough, chest tightness, shortness of breath and wheezing.   Cardiovascular: Negative for chest pain and palpitations.  Gastrointestinal: Negative for diarrhea and nausea.  Endocrine: Negative for cold intolerance.  Genitourinary: Negative for difficulty urinating.  Musculoskeletal: Negative.   Allergic/Immunologic: Positive for food allergies (Shellfish). Negative for environmental allergies.       Allergies: Phenergan  Neurological: Negative for dizziness, tremors, seizures, syncope, numbness and headaches.  Psychiatric/Behavioral: Positive for dysphoric mood (Stabilized with medication prior to discharge) and sleep disturbance (Stabilized with medication prior to discharge). Negative for agitation, behavioral problems, confusion, decreased concentration, hallucinations, self-injury and suicidal ideas. The patient is not nervous/anxious (Stable) and is not hyperactive.     Blood pressure (!) 137/98, pulse 85, temperature 98.4 F (36.9 C), temperature source  Oral, resp. rate 18, last menstrual period 06/16/2019, SpO2 97 %.There is no height or weight on file to calculate BMI.  See Md's discharge SRA  Sleep:  Number of Hours: 2.75   Has this patient used any form of tobacco in the last 30 days? (Cigarettes, Smokeless Tobacco, Cigars, and/or Pipes): N/A  Blood Alcohol level:  Lab Results  Component Value Date   ETH <10 25/95/6387   Metabolic Disorder Labs:  Lab Results  Component Value Date   HGBA1C 5.5 03/07/2018   No results found for: PROLACTIN Lab Results  Component Value Date   CHOL 192 01/30/2017   TRIG 68 01/30/2017   HDL 64 01/30/2017   CHOLHDL 3.0 01/30/2017   LDLCALC 113 (H) 01/30/2017   See Psychiatric Specialty Exam and Suicide Risk Assessment completed by Attending Physician prior to discharge.  Discharge destination:  Home  Is patient on multiple antipsychotic therapies at discharge:  No   Has Patient had three or more failed trials of antipsychotic monotherapy by history:  No  Recommended Plan for  Multiple Antipsychotic Therapies: NA  Allergies as of 07/10/2019      Reactions   Shellfish Allergy Itching   Phenergan [promethazine Hcl] Anxiety      Medication List    STOP taking these medications   ALPRAZolam 0.5 MG tablet Commonly known as: XANAX   amoxicillin 875 MG tablet Commonly known as: AMOXIL   cetirizine 10 MG tablet Commonly known as: ZYRTEC   dicyclomine 20 MG tablet Commonly known as: BENTYL   ELDERBERRY PO   ibuprofen 200 MG tablet Commonly known as: ADVIL   methocarbamol 500 MG tablet Commonly known as: ROBAXIN   ondansetron 4 MG disintegrating tablet Commonly known as: Zofran ODT   oxyCODONE-acetaminophen 5-325 MG tablet Commonly known as: Percocet   VITAMIN C PO   Vitamin D (Ergocalciferol) 1.25 MG (50000 UNIT) Caps capsule Commonly known as: DRISDOL   VITAMIN D3 PO     TAKE these medications     Indication  citalopram 20 MG tablet Commonly known as: CELEXA Take 1  tablet (20 mg total) by mouth daily. For depression What changed: additional instructions  Indication: Depression   fluticasone 50 MCG/ACT nasal spray Commonly known as: FLONASE Place 1 spray into both nostrils daily.  Indication: Allergic Rhinitis, Signs and Symptoms of Nose Diseases   hydrOXYzine 25 MG tablet Commonly known as: ATARAX/VISTARIL Take 1 tablet (25 mg total) by mouth every 6 (six) hours as needed. For anxiety What changed:   when to take this  reasons to take this  additional instructions  Indication: Feeling Anxious      Follow-up Information    [place], [place], [place]. Go on 08/01/2019.   Specialty: Family Medicine Why: You are scheduled for an appointment on 08/01/19 at 1:40 pm with Joyce Gross ([place]).  This appointment will be held in person. Please be sure to provide any discharge paperwork from this hospitalization, including your list of medications.  Contact information: [address] [place] [address] [phone]          Follow-up recommendations: Activity:  As tolerated Diet: As recommended by your primary care doctor. Keep all scheduled follow-up appointments as recommended.  Comments: Prescriptions given at discharge.  Patient agreeable to plan.  Given opportunity to ask questions.  Appears to feel comfortable with discharge denies any current suicidal or homicidal thought. Patient is also instructed prior to discharge to: Take all medications as prescribed by his/her mental healthcare provider. Report any adverse effects and or reactions from the medicines to his/her outpatient provider promptly. Patient has been instructed & cautioned: To not engage in alcohol and or illegal drug use while on prescription medicines. In the event of worsening symptoms, patient is instructed to call the crisis hotline, 911 and or go to the nearest ED for appropriate evaluation and treatment of symptoms. To follow-up with his/her primary  care provider for your other medical issues, concerns and or health care needs.  Signed: Armandina Stammer, NP, PMHNP, FNP-BC 07/10/2019, 10:15 AM   Patient seen, Suicide Assessment Completed.  Disposition Plan Reviewed

## 2019-07-10 NOTE — [facility] Suicide Risk Assessment (Signed)
[facility] Discharge Suicide Risk Assessment   Principal Problem: S/P Overdose Discharge Diagnoses: Active Problems:   MDD (major depressive disorder), severe ([place])   Total Time spent with patient: 30 minutes  Musculoskeletal: Strength & Muscle Tone: within normal limits Gait & Station: normal Patient leans: N/A  Psychiatric Specialty Exam: Review of Systems no headache, no chest pain, no shortness of breath, no nausea, no vomiting, reports some cramping related to menses.  Blood pressure (!) 137/98, pulse 85, temperature 98.4 F (36.9 C), temperature source Oral, resp. rate 18, last menstrual period 06/16/2019, SpO2 97 %.There is no height or weight on file to calculate BMI.  General Appearance: Well Groomed  Eye Contact::  Good  Speech:  Normal [id number]  Volume:  Normal  Mood:  Reports mood as "fine". Denies depression. Appears euthymic  Affect:  Appropriate and Full Range  Thought Process:  Linear and Descriptions of Associations: Intact  Orientation:  Full (Time, Place, and Person)  Thought Content:  No hallucinations, no delusions, not internally preoccupied  Suicidal Thoughts:  No denies suicidal or self-injurious ideations, denies homicidal or violent ideations  Homicidal Thoughts:  No  Memory:  Recent and remote grossly intact  Judgement:  Other:  Improving  Insight:  Fair/improving  Psychomotor Activity:  Normal-no psychomotor agitation or restlessness  Concentration:  Good  Recall:  Good  Fund of Knowledge:Good  Language: Good  Akathisia:  Negative  Handed:  Right  AIMS (if indicated):     Assets:  Communication Skills Desire for Improvement Resilience  Sleep:  Number of Hours: 2.75  Cognition: WNL  ADL's:  Intact   Mental Status Per Nursing Assessment::   On Admission:  NA  Demographic Factors:  34 year old, has a 16 year old daughter, employed  Loss Factors: Work-related stressors  Historical Factors: History of anxiety and depression. She was started on  Celexa about a year or 2 ago. She reports history of panic attacks. Denies history of suicide attempts or self-injurious behaviors.  Risk Reduction Factors:   Responsible for children under 59 years of age, Sense of responsibility to family, Employed, Living with another person, especially a relative and Positive coping skills or problem solving skills  Continued Clinical Symptoms:  Patient reports she is doing well. She denies significant anxiety at this time and has not had episodes of acute anxiety or panic since admission. She describes her mood as within normal and presents euthymic. She denies suicidal ideations and presents future oriented. No psychotic symptoms are noted or endorsed. She remains future oriented. Plans to return to work next week. She is also looking forward to seeing her daughter and states that her mother will be staying with her for period time for added support. Behavior on unit is in good control, pleasant on approach. She is tolerating Celexa well without side effects.   Cognitive Features That Contribute To Risk:  No gross cognitive deficits noted upon discharge. Is alert , attentive, and oriented x 3   Suicide Risk:  Mild:  Suicidal ideation of limited frequency, intensity, duration, and specificity.  There are no identifiable plans, no associated intent, mild dysphoria and related symptoms, good self-control (both objective and subjective assessment), few other risk factors, and identifiable protective factors, including available and accessible social support.  Follow-up Information    [place], [place], [place]. Go on 08/01/2019.   Specialty: Family Medicine Why: You are scheduled for an appointment on 08/01/19 at 1:40 pm with Kandyce Rud ([place]).  This appointment will be held in person. Please be sure  to provide any discharge paperwork from this hospitalization, including your list of medications.  Contact information: [address] [place] [address] [phone]           Plan Of Care/Follow-up recommendations:  Activity:  As tolerated Diet:  Heart healthy Tests:  NA Other:  See below  Patient is expressing readiness for discharge. She is leaving unit in good spirits. There are no current grounds for involuntary commitment. She plans to return home. She plans to follow-up with her PCP. She is aware that her BP readings have trended high and she plans to follow-up with her PCP regarding ongoing monitoring and initiating treatment if needed.  Craige Cotta, MD 07/10/2019, 9:58 AM

## 2019-07-10 NOTE — Progress Notes (Signed)
Patient did attend the evening wrap up group. Positive thinking and self-care were discussed. Pt was attentive, supportive, and engaged.

## 2019-07-10 NOTE — Progress Notes (Signed)
  [facility] Adult Case Management Discharge Plan :  Will you be returning to the same living situation after discharge:  Yes,  patient is returning home At discharge, do you have transportation home?: Yes,  patient reports her cousin is picking her up Do you have the ability to pay for your medications: Yes,  Medicaid  Release of information consent forms completed and in the chart;  Patient's signature needed at discharge.  Patient to Follow up at: Follow-up Information    [place], [place], [place]. Go on 08/01/2019.   Specialty: Family Medicine Why: You are scheduled for an appointment on 08/01/19 at 1:40 pm with Joyce Gross ([place]).  This appointment will be held in person. Please be sure to provide any discharge paperwork from this hospitalization, including your list of medications.  Contact information: [address] [place] [address] [phone]           Next level of care provider has access to [facility] Link:yes  Safety Planning and Suicide Prevention discussed: Yes,  with the patient     Has patient been referred to the Quitline?: N/A patient is not a smoker  Patient has been referred for addiction treatment: N/A  Maeola Sarah, LCSWA 07/10/2019, 10:44 AM

## 2019-07-10 NOTE — [facility] Suicide Risk Assessment (Signed)
[facility] INPATIENT:  Family/Significant Other Suicide Prevention Education  Suicide Prevention Education:  Patient Refusal for Family/Significant Other Suicide Prevention Education: The patient Jodi Cochran has refused to provide written consent for family/significant other to be provided Family/Significant Other Suicide Prevention Education during admission and/or prior to discharge.  Physician notified.  SPE completed with patient, as patient refused to consent to family contact. SPI pamphlet provided to pt and pt was encouraged to share information with support network, ask questions, and talk about any concerns relating to SPE. Patient denies access to guns/firearms and verbalized understanding of information provided. Mobile Crisis information also provided to patient.    Maeola Sarah 07/10/2019, 9:51 AM

## 2019-07-10 NOTE — Progress Notes (Signed)
Discharge Note:  Patient discharged home with family member.  Patient denied SI and HI.  Denied A/V hallucinations.  Suicide prevention information given and discussed with patient who stated she understood and had no questions.  Patient stated she received all her belongings, clothing, toiletries, misc items, etc.  Patient stated she  appreciated all assistance received from [facility] staff.  All required discharge information given to patient at discharge.  

## 2019-07-10 NOTE — Progress Notes (Signed)
D:  Patient's self inventory sheet, patient has fair sleep, no sleep medication.  Fair appetite, normal energy level, good concentration.  Denied depression, anxiety and hopeless.  Denied withdrawals.  Denied SI.  Denied physical problems.  Denied physical pain.  Goal is see daughter.  Plans to discharge.  "Ms Jodi Cochran was awesome and Ms. Jodi Cochran!" A:  Patient refused her medications.  Patient stated she did not need to take any medications.  Emotional support and encouragement given patient. R:  Denied SI and HI, contracts for safety.  Denied A/V hallucinations.  Denied pain.  Safety maintained with 15 minute checks.

## 2019-07-10 NOTE — Progress Notes (Signed)
Patient refused all medications.

## 2019-09-13 ENCOUNTER — Emergency Department (HOSPITAL_BASED_OUTPATIENT_CLINIC_OR_DEPARTMENT_OTHER)
Admission: EM | Admit: 2019-09-13 | Discharge: 2019-09-13 | Disposition: A | Discharge status: Home / Self Care | Payer: Medicaid Other | Attending: Emergency Medicine | Admitting: Emergency Medicine

## 2019-09-13 ENCOUNTER — Encounter (HOSPITAL_BASED_OUTPATIENT_CLINIC_OR_DEPARTMENT_OTHER): Payer: Self-pay | Admitting: Emergency Medicine

## 2019-09-13 ENCOUNTER — Other Ambulatory Visit: Payer: Self-pay

## 2019-09-13 ENCOUNTER — Emergency Department (HOSPITAL_BASED_OUTPATIENT_CLINIC_OR_DEPARTMENT_OTHER): Payer: Medicaid Other

## 2019-09-13 DIAGNOSIS — J309 Allergic rhinitis, unspecified: Secondary | ICD-10-CM | POA: Insufficient documentation

## 2019-09-13 DIAGNOSIS — Z20822 Contact with and (suspected) exposure to covid-19: Secondary | ICD-10-CM | POA: Diagnosis not present

## 2019-09-13 DIAGNOSIS — F17290 Nicotine dependence, other tobacco product, uncomplicated: Secondary | ICD-10-CM | POA: Insufficient documentation

## 2019-09-13 DIAGNOSIS — R05 Cough: Secondary | ICD-10-CM | POA: Diagnosis present

## 2019-09-13 LAB — GROUP A STREP BY PCR: Group A Strep by PCR: NOT DETECTED

## 2019-09-13 LAB — SARS CORONAVIRUS 2 BY RT PCR (HOSPITAL ORDER, PERFORMED IN ~~LOC~~ HOSPITAL LAB): SARS Coronavirus 2: NEGATIVE

## 2019-09-13 MED ORDER — FLUTICASONE PROPIONATE 50 MCG/ACT NA SUSP
2.0000 | Freq: Every day | NASAL | 2 refills | Status: DC
Start: 2019-09-13 — End: 2020-12-02

## 2019-09-13 MED ORDER — BENZONATATE 100 MG PO CAPS
100.0000 mg | ORAL_CAPSULE | Freq: Three times a day (TID) | ORAL | 0 refills | Status: AC
Start: 2019-09-13 — End: ?

## 2019-09-13 MED ORDER — CETIRIZINE HCL 10 MG PO TABS: 10.0000 mg | ORAL_TABLET | Freq: Every day | ORAL | 1 refills | Status: AC

## 2019-09-13 NOTE — Discharge Instructions (Signed)
Your history and physical exam is suggestive of allergic rhinitis and seasonal allergies.  Please take the Tessalon Perls as needed for your symptoms of cough.  However, more importantly, please take daily antihistamines and daily fluticasone.    Please follow-up with your primary care provider regarding today's encounter.  You may also need to see an allergist.  Return to the ED or seek immediate medical attention should experience any new or worsening symptoms.

## 2019-09-13 NOTE — ED Notes (Signed)
X-ray at bedside

## 2019-09-13 NOTE — ED Notes (Signed)
G. Chilton Si, PA ED Provider at bedside.

## 2019-09-13 NOTE — ED Notes (Signed)
Evelena Leyden PA, ED Provider at bedside.

## 2019-09-13 NOTE — ED Triage Notes (Signed)
Pt c/o cough, sinus and chest congestion and fatigue x 2 days. Pt denies fever, denies sob. Recent treatment with prednisone for allergy issues per pt

## 2019-09-13 NOTE — ED Provider Notes (Signed)
[facility] EMERGENCY DEPARTMENT Provider Note   CSN: [id number] Arrival date & time: 09/13/19  1238     History Chief Complaint  Patient presents with   Cough    Jodi Cochran is a 34 y.o. female with no relevant past medical history presents to the ED with over a 6-week history of waxing and waning URI/allergy symptoms.  She complains of cough productive of greenish sputum, chest congestion, sinus congestion, and sore throat.  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  However, she has been coughing significantly and denies any difficulty eating or drinking.  She also complains of itchy, watery eyes.  She understands that this is likely related to her chronic seasonal allergies, but was advised by her friends and family to come to the ED for evaluation.  I reviewed patient's medical record and she was evaluated for similar symptoms on 08/10/2019 and was ultimately diagnosed with allergic rhinitis and discharged home with prednisone taper, antihistamines, montelukast, and Tessalon Perles.  Patient states that she discontinued the prednisone taper because she did not like how it affected her anxiety levels.  She also states that she was unable to fill the [employer] prescription due to cost.  Patient has not received her COVID-19 vaccine.  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   Suicide attempt (HCC) 07/08/2019   MDD (major depressive disorder), severe (HCC) 07/08/2019   Vitamin D deficiency 03/08/2018   Family history of diabetes  mellitus (DM) 03/07/2018   HPV in female    ESOPHAGEAL REFLUX 04/26/2009   HEMORRHAGE OF RECTUM AND ANUS 04/26/2009   ABDOMINAL PAIN, EPIGASTRIC 04/26/2009    Past Surgical History:  Procedure Laterality Date   ESOPHAGOGASTRODUODENOSCOPY ENDOSCOPY     NO PAST SURGERIES     ORIF CLAVICULAR FRACTURE Left 12/12/2018   Procedure: OPEN REDUCTION INTERNAL FIXATION (ORIF) CLAVICULAR FRACTURE;  Surgeon: Jones Broom, MD;  Location: [facility] ORS;  Service: Orthopedics;  Laterality: Left;     OB History    Gravida  2   Para  1   Term      Preterm  1   AB      Living  1     SAB      TAB      Ectopic      Multiple      Live Births  1           Family History  Problem Relation Age of Onset   Hypertension Mother    Depression Mother    Hypertension Maternal Grandmother    Diabetes Maternal Grandmother    Glaucoma Maternal Grandmother    Thyroid disease Maternal Grandmother    Depression Maternal Grandmother    Glaucoma Paternal Grandmother    Osteoarthritis Paternal Grandmother    Bone cancer Paternal Grandmother     Social History   Tobacco Use   Smoking status: Current Every Day Smoker    Packs/day: 0.25    Types: Cigars  Last attempt to quit: 07/01/2017    Years since quitting: 2.2   Smokeless tobacco: Never Used  Vaping Use   Vaping Use: Never used  Substance Use Topics   Alcohol use: Yes    Alcohol/week: 1.0 standard drink    Types: 1 Glasses of wine per week    Comment: qod   Drug use: Not Currently    Home Medications Prior to Admission medications   Medication Sig Start Date End Date Taking? Authorizing Provider  benzonatate (TESSALON) 100 MG capsule Take 1 capsule (100 mg total) by mouth every 8 (eight) hours. 09/13/19   Lorelee New, PA-C  cetirizine (ZYRTEC ALLERGY) 10 MG tablet Take 1 tablet (10 mg total) by mouth daily. 09/13/19   Lorelee New, PA-C  citalopram (CELEXA) 20 MG tablet Take 1 tablet (20 mg total) by  mouth daily. For depression 07/10/19   Armandina Stammer I, NP  fluticasone (FLONASE) 50 MCG/ACT nasal spray Place 2 sprays into both nostrils daily. 09/13/19   Lorelee New, PA-C  hydrOXYzine (ATARAX/VISTARIL) 25 MG tablet Take 1 tablet (25 mg total) by mouth every 6 (six) hours as needed. For anxiety 07/10/19   Armandina Stammer I, NP    Allergies    Shellfish allergy and Phenergan [promethazine hcl]  Review of Systems   Review of Systems  Physical Exam Updated Vital Signs BP (!) 162/109 (BP Location: Right Arm)    Pulse 66    Temp 99.2 F (37.3 C) (Oral)    Resp 16    Ht 5\' 8"  (1.727 m)    Wt 78 kg    LMP 09/05/2019 (Approximate)    SpO2 100%    BMI 26.15 kg/m   Physical Exam  ED Results / Procedures / Treatments   Labs (all labs ordered are listed, but only abnormal results are displayed) Labs Reviewed  SARS CORONAVIRUS 2 BY RT PCR (HOSPITAL ORDER, PERFORMED IN [facility])  GROUP A STREP BY PCR    EKG None  Radiology DG Chest Portable 1 View  Result Date: 09/13/2019 CLINICAL DATA:  Cough and fatigue for 2 days. EXAM: PORTABLE CHEST 1 VIEW COMPARISON:  November 30, 2018 FINDINGS: The heart size and mediastinal contours are within normal limits. Both lungs are clear. The visualized skeletal structures are stable. Patient status post prior fixation of left clavicle. IMPRESSION: No active disease. Electronically Signed   By: December 02, 2018 M.D.   On: 09/13/2019 14:49    Procedures Procedures (including critical care time)  Medications Ordered in ED Medications - No data to display  ED Course  I have reviewed the triage vital signs and the nursing notes.  Pertinent labs & imaging results that were available during my care of the patient were reviewed by me and considered in my medical decision making (see chart for details).    MDM Rules/Calculators/A&P                          Patient's history and physical exam is suggestive of seasonal allergies versus upper  respiratory infection versus strep pharyngitis.  Given that patient is complaining of itchy watery eyes in addition to continued sinus congestion in context of her well-established seasonal allergies, I am leaning towards acute on chronic seasonal allergies as cause of her symptoms today.  However, will obtain work-up given her concern and reports that her cough has been worsening over the course of the past 6 weeks.  Group A  strep by PCR: Not detected. COVID-19 testing: Negative. DG chest portable 1 view: Personally reviewed, no consolidation concerning for pneumonia or other acute cardiopulmonary findings.  Patient reports that she was unable to get the [facility] prescription filled because of the cost being $80.  I looked it up on [facility] and I will be able to get her a prescription of Tessalon Perles for a mere $12.  Patient was excited and agreeable to that plan.  I have also encouraged her to restart Flonase that she had discontinued after she felt as though it failed to improve her symptoms.  She will also continue take her antihistamines and montelukast.    Patient states that she has an upcoming appointment with an allergist.  She will follow-up with her PCP regarding today's encounter.  Patient is resting comfortably in no acute distress.  Do not feel as though laboratory work-up would yield any significant findings.  All of the evaluation and work-up results were discussed with the patient and any family at bedside.  Patient and/or family were informed that while patient is appropriate for discharge at this time, some medical emergencies may only develop or become detectable after a period of time.  I specifically instructed patient and/or family to return to return to the ED or seek immediate medical attention for any new or worsening symptoms.  They were provided opportunity to ask any additional questions and have none at this time.  Prior to discharge patient is feeling well, agreeable  with plan for discharge home.  They have expressed understanding of verbal discharge instructions as well as return precautions and are agreeable to the plan.   Jodi Cochran was evaluated in Emergency Department on 09/13/2019 for the symptoms described in the history of present illness. She was evaluated in the context of the global COVID-19 pandemic, which necessitated consideration that the patient might be at risk for infection with the SARS-CoV-2 virus that causes COVID-19. Institutional protocols and algorithms that pertain to the evaluation of patients at risk for COVID-19 are in a state of rapid change based on information released by regulatory bodies including the CDC and federal and state organizations. These policies and algorithms were followed during the patient's care in the ED.   Final Clinical Impression(s) / ED Diagnoses Final diagnoses:  Allergic rhinitis, unspecified seasonality, unspecified trigger    Rx / DC Orders ED Discharge Orders         Ordered    benzonatate (TESSALON) 100 MG capsule  Every 8 hours     Discontinue  Reprint     09/13/19 1620    cetirizine (ZYRTEC ALLERGY) 10 MG tablet  Daily     Discontinue  Reprint     09/13/19 1620    fluticasone (FLONASE) 50 MCG/ACT nasal spray  Daily     Discontinue  Reprint     09/13/19 1620           Lorelee New, PA-C 09/13/19 1627    Alvira Monday, MD 09/15/19 2316

## 2020-02-22 ENCOUNTER — Ambulatory Visit
Admission: EM | Admit: 2020-02-22 | Discharge: 2020-02-22 | Discharge status: Against Medical Advice | Payer: Medicaid Other

## 2020-02-22 ENCOUNTER — Ambulatory Visit
Admission: EM | Admit: 2020-02-22 | Discharge: 2020-02-22 | Disposition: A | Discharge status: Home / Self Care | Payer: Medicaid Other | Attending: Emergency Medicine | Admitting: Emergency Medicine

## 2020-02-22 ENCOUNTER — Other Ambulatory Visit: Payer: Self-pay

## 2020-02-22 DIAGNOSIS — R519 Headache, unspecified: Secondary | ICD-10-CM

## 2020-02-22 DIAGNOSIS — I10 Essential (primary) hypertension: Secondary | ICD-10-CM

## 2020-02-22 DIAGNOSIS — R5383 Other fatigue: Secondary | ICD-10-CM | POA: Diagnosis not present

## 2020-02-22 MED ORDER — AMLODIPINE BESYLATE 5 MG PO TABS: 5.0000 mg | ORAL_TABLET | Freq: Every day | ORAL | 0 refills | Status: AC

## 2020-02-22 NOTE — ED Notes (Signed)
EKG performed and copy of report given to [place], [place].

## 2020-02-22 NOTE — ED Notes (Signed)
Pt walked out and left . 

## 2020-02-22 NOTE — Discharge Instructions (Addendum)
Begin amlodipine daily over the next month Follow-up with primary care in 2 to 4 weeks for blood pressure recheck and follow-up of symptoms Please also follow-up with cardiology given changes noted on EKG today Please go to emergency room if developing worsening headaches, vision changes, difficulty speaking, facial drooping, one-sided weakness, chest pain, numbness tingling on one side

## 2020-02-22 NOTE — ED Triage Notes (Addendum)
Pt come in with complaints of high BP. 160/111 over night, indigestion, intermittent headaches, fatigue, hand tingling/numbness, and dull sharp pain in the left upper arm that have been lasting for about 2 weeks. Cap refill less then 3 seconds and +2 radial pulses.

## 2020-02-22 NOTE — ED Provider Notes (Signed)
[facility]    CSN: [id number] Arrival date & time: 02/22/20  1504      History   Chief Complaint Chief Complaint  Patient presents with   Hypertension   Fatigue    HPI Jodi Cochran is a 34 y.o. female history of GERD, presenting today for evaluation of elevated blood pressure.  Patient reports that her blood pressure has been 160/111 last night.  She has also had associated indigestion, headaches, fatigue as well as hand paresthesias.  She reports a dull sharp pain in her left upper arm for the past 2 weeks.  All of the symptoms are intermittent, denies at current.  She does have a primary care and reports that they have been monitoring her blood pressure.  Concerned on if she needs to start medicines at this time.  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   Suicide attempt (HCC) 07/08/2019   MDD (major depressive disorder), severe (HCC) 07/08/2019   Vitamin D deficiency 03/08/2018   Family history of diabetes mellitus (DM) 03/07/2018   HPV in female    ESOPHAGEAL REFLUX 04/26/2009   HEMORRHAGE OF RECTUM AND ANUS 04/26/2009   ABDOMINAL PAIN, EPIGASTRIC 04/26/2009    Past Surgical History:  Procedure Laterality Date   ESOPHAGOGASTRODUODENOSCOPY ENDOSCOPY     NO PAST SURGERIES     ORIF CLAVICULAR FRACTURE Left 12/12/2018   Procedure: OPEN REDUCTION INTERNAL FIXATION (ORIF) CLAVICULAR FRACTURE;  Surgeon: Jones Broom, MD;  Location: [facility];  Service: Orthopedics;  Laterality: Left;    OB History    Gravida  2   Para  1   Term      Preterm  1   AB      Living  1     SAB      IAB      Ectopic       Multiple      Live Births  1            Home Medications    Prior to Admission medications   Medication Sig Start Date End Date Taking? Authorizing Provider  amLODipine (NORVASC) 5 MG tablet Take 1 tablet (5 mg total) by mouth daily. 02/22/20   Sachin Ferencz C, PA-C  benzonatate (TESSALON) 100 MG capsule Take 1 capsule (100 mg total) by mouth every 8 (eight) hours. 09/13/19   Lorelee New, PA-C  cetirizine (ZYRTEC ALLERGY) 10 MG tablet Take 1 tablet (10 mg total) by mouth daily. 09/13/19   Lorelee New, PA-C  citalopram (CELEXA) 20 MG tablet Take 1 tablet (20 mg total) by mouth daily. For depression 07/10/19   Armandina Stammer I, NP  fluticasone (FLONASE) 50 MCG/ACT nasal spray Place 2 sprays into both nostrils daily. 09/13/19   Lorelee New, PA-C  hydrOXYzine (ATARAX/VISTARIL) 25 MG tablet Take 1 tablet (25 mg total) by mouth every 6 (six) hours as needed. For anxiety 07/10/19   Armandina Stammer I, NP    Family History Family History  Problem Relation Age of Onset   Hypertension Mother    Depression Mother    Hypertension Maternal  Grandmother    Diabetes Maternal Grandmother    Glaucoma Maternal Grandmother    Thyroid disease Maternal Grandmother    Depression Maternal Grandmother    Glaucoma Paternal Grandmother    Osteoarthritis Paternal Grandmother    Bone cancer Paternal Grandmother     Social History Social History   Tobacco Use   Smoking status: Current Every Day Smoker    Packs/day: 0.25    Types: Cigars    Last attempt to quit: 07/01/2017    Years since quitting: 2.6   Smokeless tobacco: Never Used  Vaping Use   Vaping Use: Never used  Substance Use Topics   Alcohol use: Yes    Alcohol/week: 1.0 standard drink    Types: 1 Glasses of wine per week    Comment: qod   Drug use: Not Currently     Allergies   Shellfish allergy and Phenergan [promethazine hcl]   Review of Systems Review of Systems  Constitutional: Positive for  fatigue. Negative for fever.  HENT: Negative for congestion, sinus pressure and sore throat.   Eyes: Negative for photophobia, pain and visual disturbance.  Respiratory: Negative for cough and shortness of breath.   Cardiovascular: Negative for chest pain.  Gastrointestinal: Positive for nausea. Negative for abdominal pain and vomiting.  Genitourinary: Negative for decreased urine volume and hematuria.  Musculoskeletal: Negative for myalgias, neck pain and neck stiffness.  Neurological: Positive for headaches. Negative for dizziness, syncope, facial asymmetry, speech difficulty, weakness, light-headedness and numbness.     Physical Exam Triage Vital Signs ED Triage Vitals  Enc Vitals Group     BP 02/22/20 1512 (S) (!) 144/91     Pulse Rate 02/22/20 1512 69     Resp 02/22/20 1512 16     Temp 02/22/20 1512 98.6 F (37 C)     Temp Source 02/22/20 1512 Oral     SpO2 02/22/20 1512 98 %     Weight --      Height --      Head Circumference --      Peak Flow --      Pain Score 02/22/20 1511 5     Pain Loc --      Pain Edu? --      Excl. in GC? --    No data found.  Updated Vital Signs BP (S) (!) 144/91 (BP Location: Left Arm)    Pulse 69    Temp 98.6 F (37 C) (Oral)    Resp 16    LMP 02/17/2020    SpO2 98%   Visual Acuity Right Eye Distance:   Left Eye Distance:   Bilateral Distance:    Right Eye Near:   Left Eye Near:    Bilateral Near:     Physical Exam Vitals and nursing note reviewed.  Constitutional:      Appearance: She is well-developed and well-nourished.     Comments: No acute distress  HENT:     Head: Normocephalic and atraumatic.     Nose: Nose normal.  Eyes:     Extraocular Movements: Extraocular movements intact.     Conjunctiva/sclera: Conjunctivae normal.     Pupils: Pupils are equal, round, and reactive to light.  Cardiovascular:     Rate and Rhythm: Normal rate and regular rhythm.  Pulmonary:     Effort: Pulmonary effort is normal. No  respiratory distress.     Comments: Breathing comfortably at rest, CTABL, no wheezing, rales or other adventitious sounds auscultated  Abdominal:  General: There is no distension.  Musculoskeletal:        General: Normal range of motion.     Cervical back: Neck supple.  Skin:    General: Skin is warm and dry.  Neurological:     Mental Status: She is alert and oriented to person, place, and time.     Comments: Patient A&O x3, cranial nerves II-XII grossly intact, strength at shoulders, hips and knees 5/5, equal bilaterally.  Gait without abnormality.   Psychiatric:        Mood and Affect: Mood and affect normal.      UC Treatments / Results  Labs (all labs ordered are listed, but only abnormal results are displayed) Labs Reviewed - No data to display  EKG EKG normal sinus rhythm, does have nonspecific T wave inversions noted in leads II, III, aVF as well as leads V3-V6, these are new from prior EKG in April 2021.  Radiology No results found.  Procedures Procedures (including critical care time)  Medications Ordered in UC Medications - No data to display  Initial Impression / Assessment and Plan / UC Course  I have reviewed the triage vital signs and the nursing notes.  Pertinent labs & imaging results that were available during my care of the patient were reviewed by me and considered in my medical decision making (see chart for details).    Hypertension-has been consistently elevated at prior visits, exam reassuring today and currently reporting to be asymptomatic.  No neuro deficits.  Denies any chest pain.  Opting to place on amlodipine 5 mg daily, will have blood pressure recheck in 2 to 4 weeks.  Will have patient monitor other nonspecific symptoms she has been experiencing for improvement/decrease in frequency.  Recommended to restart her pantoprazole.  Discussed EKG changes on EKG, given she is asymptomatic, recommending cardiology outpatient follow-up.  If any  symptoms progressing or worsening to follow-up in emergency room.  Discussed strict return precautions. Patient verbalized understanding and is agreeable with plan.  Final Clinical Impressions(s) / UC Diagnoses   Final diagnoses:  Essential hypertension  Fatigue, unspecified type  Acute nonintractable headache, unspecified headache type     Discharge Instructions     Begin amlodipine daily over the next month Follow-up with primary care in 2 to 4 weeks for blood pressure recheck and follow-up of symptoms Please also follow-up with cardiology given changes noted on EKG today Please go to emergency room if developing worsening headaches, vision changes, difficulty speaking, facial drooping, one-sided weakness, chest pain, numbness tingling on one side    ED Prescriptions    Medication Sig Dispense Auth. Provider   amLODipine (NORVASC) 5 MG tablet Take 1 tablet (5 mg total) by mouth daily. 30 tablet Partick Musselman, [place] C, PA-C     PDMP not reviewed this encounter.   Lew Dawes, [place] 02/23/20 1107

## 2020-12-02 ENCOUNTER — Other Ambulatory Visit: Payer: Self-pay

## 2020-12-02 ENCOUNTER — Encounter (HOSPITAL_BASED_OUTPATIENT_CLINIC_OR_DEPARTMENT_OTHER): Payer: Self-pay

## 2020-12-02 ENCOUNTER — Emergency Department (HOSPITAL_BASED_OUTPATIENT_CLINIC_OR_DEPARTMENT_OTHER)
Admission: EM | Admit: 2020-12-02 | Discharge: 2020-12-02 | Disposition: A | Discharge status: Home / Self Care | Payer: Medicaid Other | Attending: Emergency Medicine | Admitting: Emergency Medicine

## 2020-12-02 DIAGNOSIS — J019 Acute sinusitis, unspecified: Secondary | ICD-10-CM | POA: Diagnosis not present

## 2020-12-02 DIAGNOSIS — R519 Headache, unspecified: Secondary | ICD-10-CM | POA: Diagnosis present

## 2020-12-02 DIAGNOSIS — H109 Unspecified conjunctivitis: Secondary | ICD-10-CM

## 2020-12-02 DIAGNOSIS — H1132 Conjunctival hemorrhage, left eye: Secondary | ICD-10-CM | POA: Insufficient documentation

## 2020-12-02 DIAGNOSIS — J329 Chronic sinusitis, unspecified: Secondary | ICD-10-CM

## 2020-12-02 DIAGNOSIS — Z87891 Personal history of nicotine dependence: Secondary | ICD-10-CM | POA: Insufficient documentation

## 2020-12-02 DIAGNOSIS — H1032 Unspecified acute conjunctivitis, left eye: Secondary | ICD-10-CM | POA: Diagnosis not present

## 2020-12-02 LAB — BASIC METABOLIC PANEL
Anion gap: 9 (ref 5–15)
BUN: 8 mg/dL (ref 6–20)
CO2: 24 mmol/L (ref 22–32)
Calcium: 9.2 mg/dL (ref 8.9–10.3)
Chloride: 104 mmol/L (ref 98–111)
Creatinine, Ser: 0.76 mg/dL (ref 0.44–1.00)
GFR, Estimated: >60 mL/min (ref >60)
Glucose, Bld: 80 mg/dL (ref 70–99)
Potassium: 3.6 mmol/L (ref 3.5–5.1)
Sodium: 137 mmol/L (ref 135–145)

## 2020-12-02 LAB — PREGNANCY, URINE: Preg Test, Ur: NEGATIVE

## 2020-12-02 MED ORDER — POLYMYXIN B-TRIMETHOPRIM 10000-0.1 UNIT/ML-% OP SOLN: 1.0000 [drp] | OPHTHALMIC | 0 refills | Status: AC

## 2020-12-02 MED ORDER — AMOXICILLIN-POT CLAVULANATE 875-125 MG PO TABS: 1.0000 | ORAL_TABLET | Freq: Two times a day (BID) | ORAL | 0 refills | Status: AC

## 2020-12-02 MED ORDER — FLUTICASONE PROPIONATE 50 MCG/ACT NA SUSP: 2.0000 | Freq: Every day | NASAL | 2 refills | Status: AC

## 2020-12-02 NOTE — ED Provider Notes (Signed)
[facility] EMERGENCY DEPARTMENT Provider Note   CSN: [id number] Arrival date & time: 12/02/20  1347     History Chief Complaint  Patient presents with   Facial Pain    Jodi Cochran is a 35 y.o. female.  Presented to the emergency room with concern for nose pain and left eye redness.  Patient states that her nose complaint has been ongoing for the last 3 weeks.  Has noted increased sinus congestion and pain around her nose, some intermittent bloody noses.  No active bleeding now.  Also started having some redness around her left eye and slight swelling to her eyelids.  Noted yellow crusting in her eye today.  No pain in the eye itself, no pain with eye movement.  Denies any blurry vision.  Went to urgent care and was recommended she come to ER for further evaluation.  No fevers or chills.  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   Suicide attempt (HCC) 07/08/2019   MDD (major depressive disorder), severe (HCC) 07/08/2019   Vitamin D deficiency 03/08/2018   Family history of diabetes mellitus (DM) 03/07/2018   HPV in female    ESOPHAGEAL REFLUX 04/26/2009   HEMORRHAGE OF RECTUM AND ANUS 04/26/2009   ABDOMINAL PAIN, EPIGASTRIC 04/26/2009    Past Surgical History:  Procedure Laterality Date   ESOPHAGOGASTRODUODENOSCOPY ENDOSCOPY     NO PAST SURGERIES     ORIF CLAVICULAR FRACTURE Left 12/12/2018   Procedure: OPEN REDUCTION INTERNAL FIXATION (ORIF) CLAVICULAR FRACTURE;  Surgeon: Jones Broom, MD;  Location: [facility];  Service: Orthopedics;  Laterality: Left;     OB History     Gravida  2   Para  1   Term      Preterm  1   AB       Living  1      SAB      IAB      Ectopic      Multiple      Live Births  1           Family History  Problem Relation Age of Onset   Hypertension Mother    Depression Mother    Hypertension Maternal Grandmother    Diabetes Maternal Grandmother    Glaucoma Maternal Grandmother    Thyroid disease Maternal Grandmother    Depression Maternal Grandmother    Glaucoma Paternal Grandmother    Osteoarthritis Paternal Grandmother    Bone cancer Paternal Grandmother     Social History   Tobacco Use   Smoking status: Former    Packs/day: 0.25    Types: Cigars, Cigarettes    Quit date: 07/01/2017    Years since quitting: 3.4   Smokeless tobacco: Never  Vaping Use   Vaping Use: Never used  Substance Use Topics   Alcohol use: Yes    Alcohol/week: 1.0 standard drink    Types: 1 Glasses of wine per week    Comment: occ   Drug use: Not Currently    Home Medications Prior to Admission medications   Medication Sig Start Date End  Date Taking? Authorizing Provider  amoxicillin-clavulanate (AUGMENTIN) 875-125 MG tablet Take 1 tablet by mouth every 12 (twelve) hours for 10 days. 12/02/20 12/12/20 Yes Larisha Vencill, [place] Livings, MD  trimethoprim-polymyxin b (POLYTRIM) ophthalmic solution Place 1 drop into the left eye every 4 (four) hours while awake for 10 days. 12/02/20 12/12/20 Yes Callaway Hardigree, Quitman Livings, MD  amLODipine (NORVASC) 5 MG tablet Take 1 tablet (5 mg total) by mouth daily. 02/22/20   Wieters, Hallie C, PA-C  benzonatate (TESSALON) 100 MG capsule Take 1 capsule (100 mg total) by mouth every 8 (eight) hours. 09/13/19   Lorelee New, PA-C  cetirizine (ZYRTEC ALLERGY) 10 MG tablet Take 1 tablet (10 mg total) by mouth daily. 09/13/19   Lorelee New, PA-C  citalopram (CELEXA) 20 MG tablet Take 1 tablet (20 mg total) by mouth daily. For depression 07/10/19   Armandina Stammer I, NP  fluticasone (FLONASE) 50 MCG/ACT nasal spray Place 2 sprays into both nostrils daily. 12/02/20   Milagros Loll, MD  hydrOXYzine (ATARAX/VISTARIL) 25 MG tablet Take 1 tablet (25 mg total) by mouth every 6 (six) hours as needed. For anxiety 07/10/19   Sanjuana Kava, NP    Allergies    Phenergan [promethazine hcl]  Review of Systems   Review of Systems  Constitutional:  Negative for chills and fever.  HENT:  Positive for congestion. Negative for ear pain and sore throat.   Eyes:  Positive for discharge and redness. Negative for pain and visual disturbance.  Respiratory:  Negative for cough and shortness of breath.   Cardiovascular:  Negative for chest pain and palpitations.  Gastrointestinal:  Negative for abdominal pain and vomiting.  Genitourinary:  Negative for dysuria and hematuria.  Musculoskeletal:  Negative for arthralgias and back pain.  Skin:  Negative for color change and rash.  Neurological:  Negative for seizures and syncope.  All other systems reviewed and are negative.  Physical Exam Updated Vital Signs BP (!) 161/113   Pulse 68   Temp 98.8 F (37.1 C) (Oral)   Resp 16   Ht 5\' 7"  (1.702 m)   Wt 77.6 kg   LMP 11/17/2020 (Approximate)   SpO2 96%   BMI 26.78 kg/m   Physical Exam Vitals and nursing note reviewed.  Constitutional:      General: She is not in acute distress.    Appearance: She is well-developed.  HENT:     Head: Normocephalic and atraumatic.     Comments: No erythema throughout careful inspection of all face    Nose:     Comments: Her nose appears normal, there is no active bleeding noted, no focal tenderness to palpation, no swelling over nose Eyes:     Conjunctiva/sclera: Conjunctivae normal.     Comments: Left upper and lower eyelid appears slightly swollen, there is no pain with eye movement, full EOM, pupils are equal round and reactive, the left eye has small subconjunctival hemorrhage, left conjunctiva appear slightly more erythematous  Cardiovascular:     Rate and Rhythm: Normal rate and regular rhythm.     Heart sounds: No murmur  heard. Pulmonary:     Effort: Pulmonary effort is normal. No respiratory distress.     Breath sounds: Normal breath sounds.  Abdominal:     Palpations: Abdomen is soft.     Tenderness: There is no abdominal tenderness.  Musculoskeletal:     Cervical back: Neck supple.  Skin:    General: Skin is warm and dry.  Neurological:  General: No focal deficit present.     Mental Status: She is alert.    ED Results / Procedures / Treatments   Labs (all labs ordered are listed, but only abnormal results are displayed) Labs Reviewed  BASIC METABOLIC PANEL  PREGNANCY, URINE    EKG None  Radiology No results found.  Procedures Procedures   Medications Ordered in ED Medications - No data to display  ED Course  I have reviewed the triage vital signs and the nursing notes.  Pertinent labs & imaging results that were available during my care of the patient were reviewed by me and considered in my medical decision making (see chart for details).    MDM Rules/Calculators/A&P                           35 year old lady presents to ER with concern for nose pain, sinus congestion, left thigh redness.  On physical exam, nose appears normal.  Left eye has small subconjunctival hemorrhage and her conjunctiva do appear somewhat erythematous.  No drainage appreciated though patient did report yellow drainage this morning.  There is no erythema on the facial skin at present to suggest cellulitis.  No pain with EOM.  No eye pain or vision change.  Suspect sinusitis, conjunctivitis.  Given duration of symptoms, provided antibiotics to cover bacterial cause.  No evidence of facial cellulitis, periorbital or orbital cellulitis based on exam.  Recommend follow-up with ophthalmology and with primary care.  Reviewed return precautions and discharged patient home.    After the discussed management above, the patient was determined to be safe for discharge.  The patient was in agreement with this plan  and all questions regarding their care were answered.  ED return precautions were discussed and the patient will return to the ED with any significant worsening of condition.  Final Clinical Impression(s) / ED Diagnoses Final diagnoses:  Sinusitis, unspecified chronicity, unspecified location  Conjunctivitis of left eye, unspecified conjunctivitis type  Conjunctival hemorrhage of left eye    Rx / DC Orders ED Discharge Orders          Ordered    fluticasone (FLONASE) 50 MCG/ACT nasal spray  Daily        12/02/20 1711    trimethoprim-polymyxin b (POLYTRIM) ophthalmic solution  Every 4 hours while awake        12/02/20 1711    amoxicillin-clavulanate (AUGMENTIN) 875-125 MG tablet  Every 12 hours        12/02/20 1711             Milagros Loll, MD 12/02/20 1721

## 2020-12-02 NOTE — ED Notes (Signed)
Patient given discharge instructions, all questions answered. Patient in possession of all belongings, directed to the discharge area  

## 2020-12-02 NOTE — Discharge Instructions (Addendum)
Recommend taking both the antibiotic pills as well as antibiotic drops.  Please follow-up both with your primary care doctor as well as with the ophthalmologist, the eye specialist.  If you develop redness around your skin and face, worsening swelling, pain with eye movement, fever, vision loss, or other new concerning symptom, come back to ER for reassessment.

## 2020-12-02 NOTE — ED Triage Notes (Signed)
Pt c/o sinus congestion/pain x 3 weeks-redness to left eye x today-seen at [facility]-was sent to ED stating she was told she had an infection in her nose that has spread to her eye and needs CT scan and IV abx-NAD-steady gait

## 2021-09-06 IMAGING — CR DG WRIST COMPLETE 3+V*L*
5 series · 5 of 5 positions shown · non-contrast
Comparison: None.

CLINICAL DATA: Fall

EXAM:
LEFT WRIST - COMPLETE 3+ VIEW

[x wrist pa left]
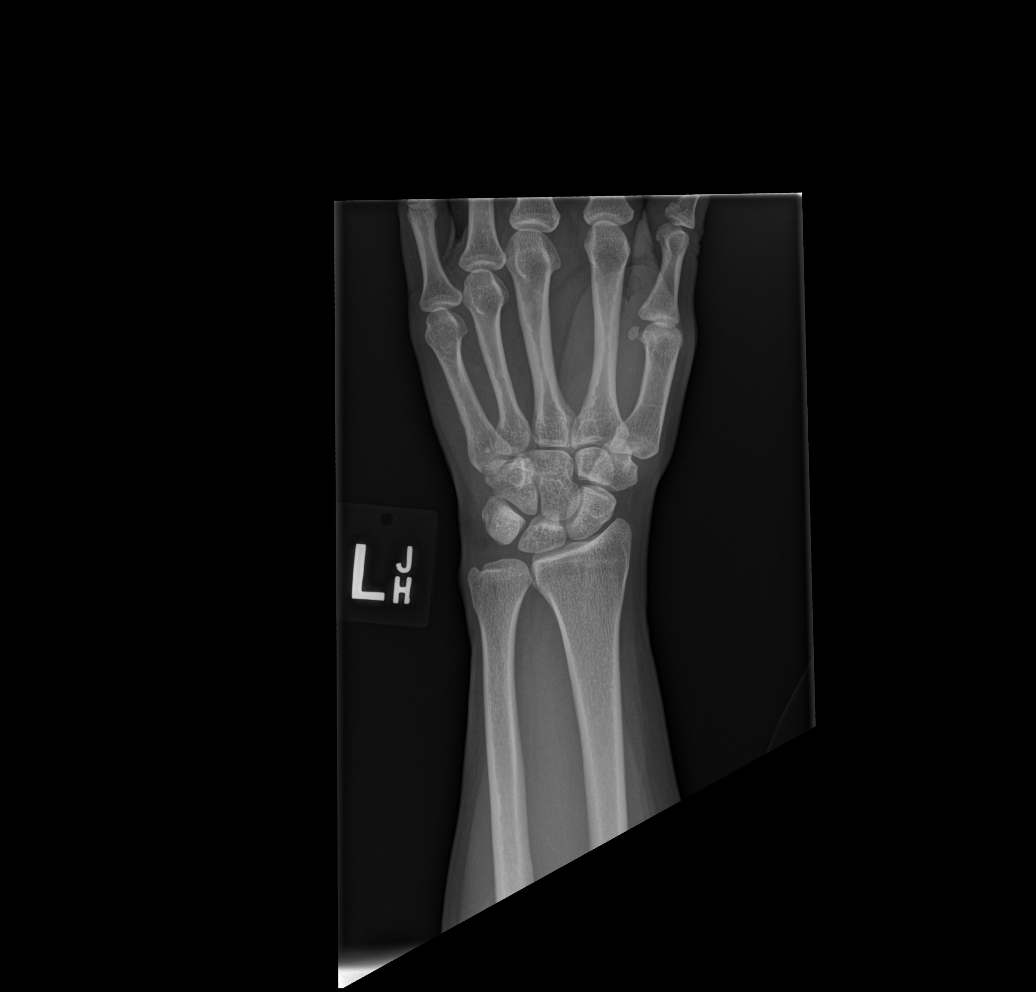

[x wrist obl left]
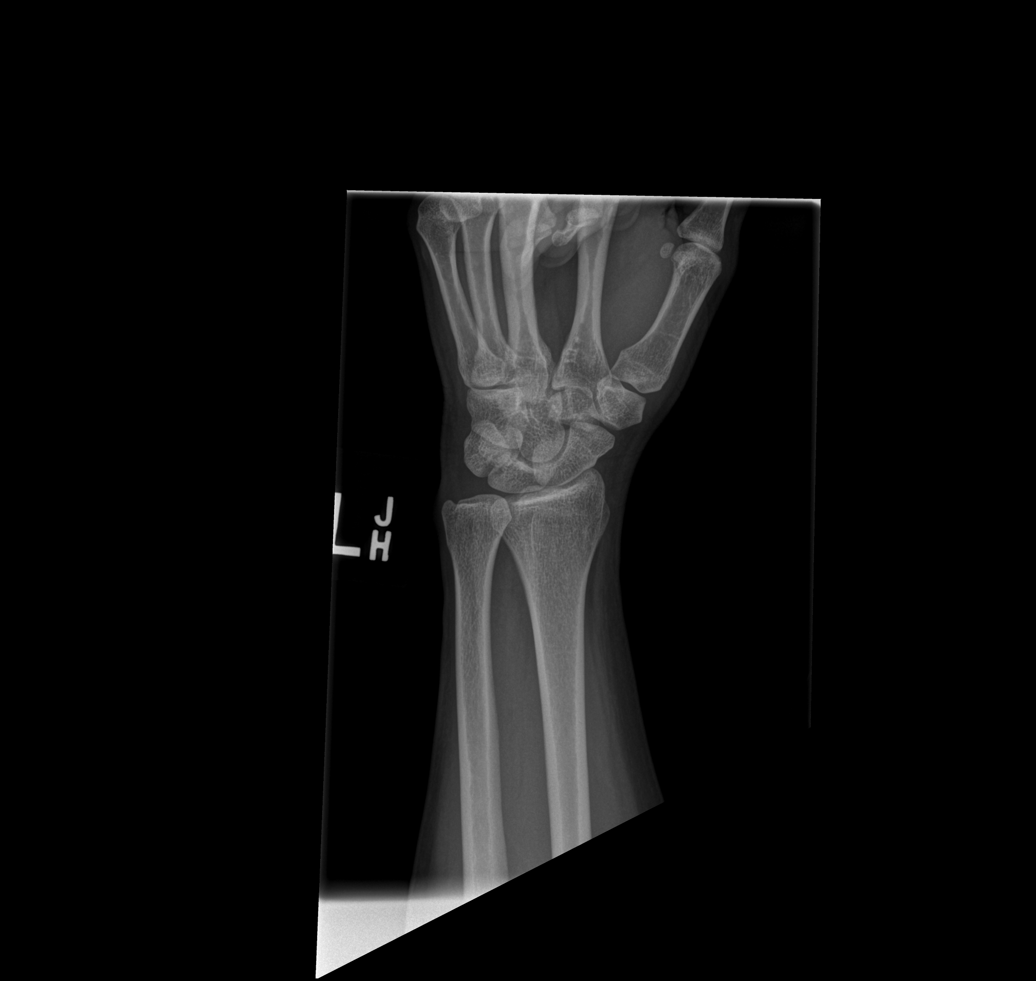

[x wrist lat left (1 of 2)]
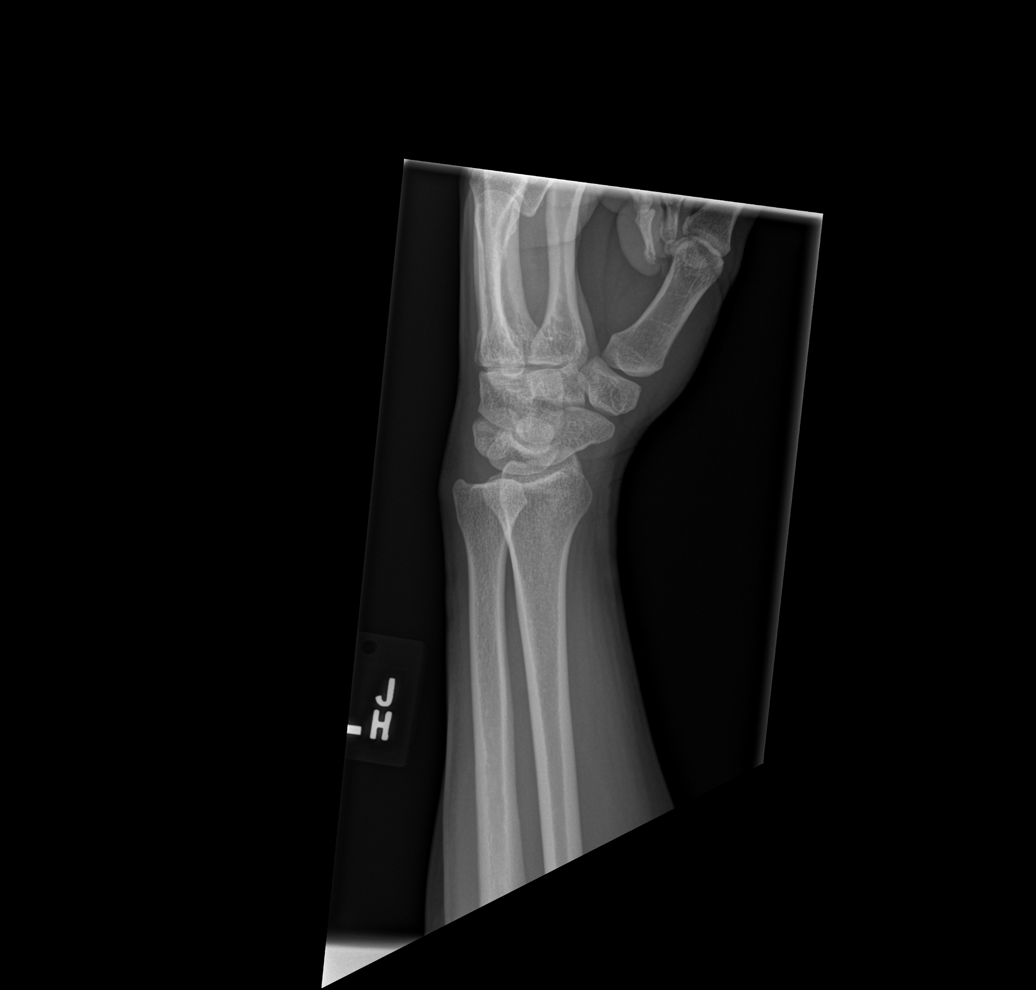

[x wrist lat left (2 of 2)]
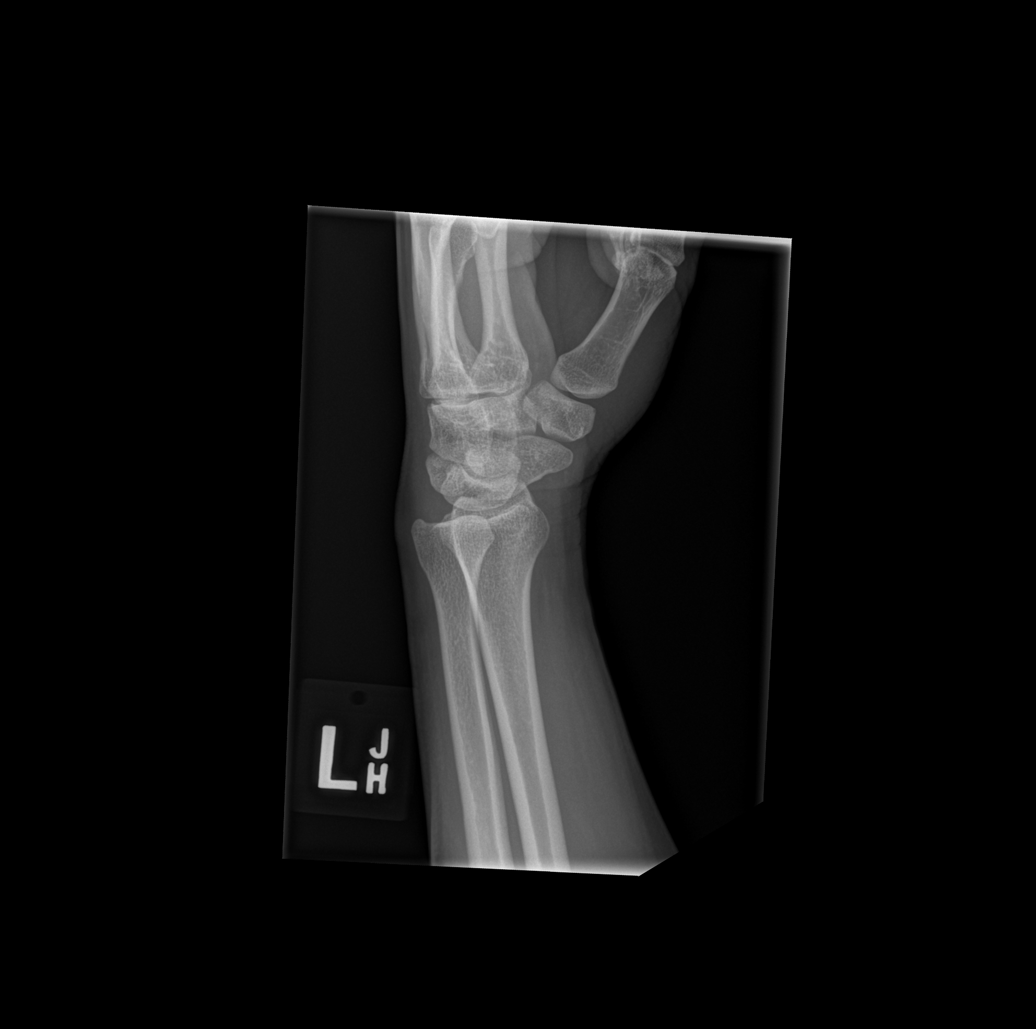

[x wrist navicular view left]
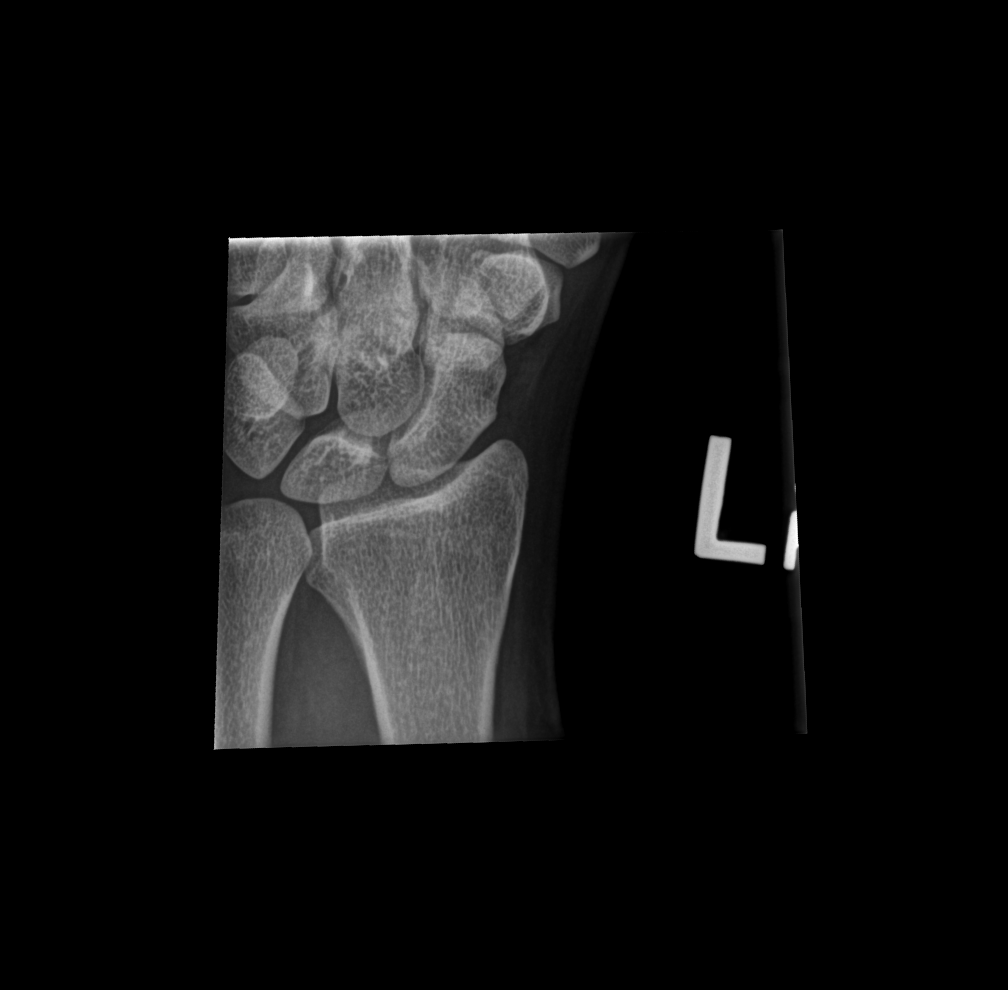

[5 of 5 positions shown; findings below may reference images not displayed]

FINDINGS: There is no evidence of fracture or dislocation. There is no
evidence of arthropathy or other focal bone abnormality. Soft
tissues are unremarkable.
IMPRESSION: Negative.

## 2022-05-28 ENCOUNTER — Ambulatory Visit
Admission: EM | Admit: 2022-05-28 | Discharge: 2022-05-28 | Disposition: A | Discharge status: Home / Self Care | Payer: Medicaid Other | Attending: Internal Medicine | Admitting: Internal Medicine

## 2022-05-28 DIAGNOSIS — B349 Viral infection, unspecified: Secondary | ICD-10-CM | POA: Insufficient documentation

## 2022-05-28 DIAGNOSIS — R509 Fever, unspecified: Secondary | ICD-10-CM | POA: Insufficient documentation

## 2022-05-28 DIAGNOSIS — Z1152 Encounter for screening for COVID-19: Secondary | ICD-10-CM | POA: Insufficient documentation

## 2022-05-28 DIAGNOSIS — J029 Acute pharyngitis, unspecified: Secondary | ICD-10-CM | POA: Insufficient documentation

## 2022-05-28 LAB — POCT RAPID STREP A (OFFICE): Rapid Strep A Screen: NEGATIVE

## 2022-05-28 MED ORDER — ONDANSETRON 4 MG PO TBDP: 4.0000 mg | ORAL_TABLET | Freq: Three times a day (TID) | ORAL | 0 refills | Status: AC | PRN

## 2022-05-28 NOTE — ED Provider Notes (Signed)
[facility]    CSN: [id number] Arrival date & time: 05/28/22  0940      History   Chief Complaint Chief Complaint  Patient presents with   Sore Throat   Headache    HPI Spiritual Bacino is a 37 y.o. female comes to urgent care with 3-day history of sore throat, fever of 101.1 Fahrenheit and headache.  Patient's symptoms started fairly abruptly and has been persistent.  She denies any vomiting.  She has some nausea but no diarrhea.  No difficulty breathing or wheezing.  No shortness of breath or chest tightness.  No rash noted.  Patient is concerned that she may have strep throat. 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/08/2019   Suicide attempt ([place]) 07/08/2019   MDD (major depressive disorder), severe ([place]) 07/08/2019   Vitamin D deficiency 03/08/2018   Family history of diabetes mellitus (DM) 03/07/2018   HPV in female    ESOPHAGEAL REFLUX 04/26/2009   HEMORRHAGE OF RECTUM AND ANUS 04/26/2009   ABDOMINAL PAIN, EPIGASTRIC 04/26/2009    Past Surgical History:  Procedure Laterality Date   ESOPHAGOGASTRODUODENOSCOPY ENDOSCOPY     NO PAST SURGERIES     ORIF CLAVICULAR FRACTURE Left 12/12/2018   Procedure: OPEN REDUCTION INTERNAL FIXATION (ORIF) CLAVICULAR FRACTURE;  Surgeon: Tania Ade, MD;  Location: [facility];  Service: Orthopedics;  Laterality: Left;    OB History     Gravida  2   Para  1   Term      Preterm  1   AB      Living  1      SAB      IAB      Ectopic      Multiple      Live Births  1            Home Medications    Prior to Admission medications   Medication Sig Start Date End Date Taking?  Authorizing Provider  ondansetron (ZOFRAN-ODT) 4 MG disintegrating tablet Take 1 tablet (4 mg total) by mouth every 8 (eight) hours as needed for nausea or vomiting. 05/28/22  Yes Kea Callan, Myrene Galas, MD  amLODipine (NORVASC) 5 MG tablet Take 1 tablet (5 mg total) by mouth daily. 02/22/20   Wieters, Hallie C, PA-C  benzonatate (TESSALON) 100 MG capsule Take 1 capsule (100 mg total) by mouth every 8 (eight) hours. 09/13/19   Corena Herter, PA-C  cetirizine (ZYRTEC ALLERGY) 10 MG tablet Take 1 tablet (10 mg total) by mouth daily. 09/13/19   Corena Herter, PA-C  citalopram (CELEXA) 20 MG tablet Take 1 tablet (20 mg total) by mouth daily. For depression 07/10/19   Lindell Spar I, NP  fluticasone (FLONASE) 50 MCG/ACT nasal spray Place 2 sprays into both nostrils daily. 12/02/20   Lucrezia Starch, MD  hydrOXYzine (ATARAX/VISTARIL) 25 MG tablet Take 1 tablet (25 mg total) by mouth every 6 (six) hours as needed. For anxiety 07/10/19   Encarnacion Slates, NP    Family History Family History  Problem Relation Age of Onset   Hypertension Mother  Depression Mother    Hypertension Maternal Grandmother    Diabetes Maternal Grandmother    Glaucoma Maternal Grandmother    Thyroid disease Maternal Grandmother    Depression Maternal Grandmother    Glaucoma Paternal Grandmother    Osteoarthritis Paternal Grandmother    Bone cancer Paternal Grandmother     Social History Social History   Tobacco Use   Smoking status: Former    Packs/day: .25    Types: 39, Cigarettes    Quit date: 07/01/2017    Years since quitting: 4.9   Smokeless tobacco: Never  Vaping Use   Vaping Use: Never used  Substance Use Topics   Alcohol use: Yes    Alcohol/week: 1.0 standard drink of alcohol    Types: 1 Glasses of wine per week    Comment: occ   Drug use: Not Currently     Allergies   Phenergan [promethazine hcl]   Review of Systems Review of Systems As per HPI.  No generalized body aches or joint  aches.  Physical Exam Triage Vital Signs ED Triage Vitals  Enc Vitals Group     BP 05/28/22 1041 125/83     Pulse Rate 05/28/22 1041 95     Resp 05/28/22 1041 17     Temp 05/28/22 1041 (!) 101.1 F (38.4 C)     Temp Source 05/28/22 1041 Oral     SpO2 05/28/22 1041 95 %     Weight --      Height --      Head Circumference --      Peak Flow --      Pain Score 05/28/22 1043 6     Pain Loc --      Pain Edu? --      Excl. in [place]? --    No data found.  Updated Vital Signs BP 125/83 (BP Location: Left Arm)   Pulse 95   Temp (!) 101.1 F (38.4 C) (Oral)   Resp 17   SpO2 95%   Visual Acuity Right Eye Distance:   Left Eye Distance:   Bilateral Distance:    Right Eye Near:   Left Eye Near:    Bilateral Near:     Physical Exam Vitals and nursing note reviewed.  Constitutional:      Appearance: She is ill-appearing.  HENT:     Right Ear: Tympanic membrane normal.     Left Ear: Tympanic membrane normal.     Nose: No rhinorrhea.     Mouth/Throat:     Mouth: Mucous membranes are moist.     Pharynx: Posterior oropharyngeal erythema present.     Tonsils: No tonsillar abscesses.  Cardiovascular:     Rate and Rhythm: Normal rate and regular rhythm.  Pulmonary:     Effort: Pulmonary effort is normal.     Breath sounds: Normal breath sounds.  Abdominal:     Palpations: Abdomen is soft.  Neurological:     Mental Status: She is alert.      UC Treatments / Results  Labs (all labs ordered are listed, but only abnormal results are displayed) Labs Reviewed  SARS CORONAVIRUS 2 (TAT 6-24 HRS)  POCT RAPID STREP A (OFFICE)    EKG   Radiology No results found.  Procedures Procedures (including critical care time)  Medications Ordered in UC Medications - No data to display  Initial Impression / Assessment and Plan / UC Course  I have reviewed the triage vital signs and the nursing notes.  Pertinent  labs & imaging results that were available during my care of the  patient were reviewed by me and considered in my medical decision making (see chart for details).     1.  Pharyngitis with viral syndrome: Point-of-care strep is negative Throat cultures have been sent Warm salt water gargle COVID-19 PCR test have been sent Will call patient with recommendations if labs are abnormal Tylenol/Motrin as needed for fever and/or bodyaches Return to urgent care if symptoms worsen. Final Clinical Impressions(s) / UC Diagnoses   Final diagnoses:  Acute pharyngitis, unspecified etiology  Pharyngitis with viral syndrome     Discharge Instructions      Please increase oral fluid intake Strep test is negative Throat cultures have been sent Warm salt water gargle Tylenol/Motrin as needed for pain and/or fever Will call you with recommendations if labs are abnormal Return to urgent care if symptoms worsen.   ED Prescriptions     Medication Sig Dispense Auth. Provider   ondansetron (ZOFRAN-ODT) 4 MG disintegrating tablet Take 1 tablet (4 mg total) by mouth every 8 (eight) hours as needed for nausea or vomiting. 10 tablet Tyshika Baldridge, Myrene Galas, MD      PDMP not reviewed this encounter.   Chase Picket, MD 05/28/22 [phone]

## 2022-05-28 NOTE — Discharge Instructions (Signed)
Please increase oral fluid intake Strep test is negative Throat cultures have been sent Warm salt water gargle Tylenol/Motrin as needed for pain and/or fever Will call you with recommendations if labs are abnormal Return to urgent care if symptoms worsen.

## 2022-05-28 NOTE — ED Triage Notes (Signed)
Pt presents with sore throat, headache, and fever X 3 days.

## 2022-05-29 LAB — SARS CORONAVIRUS 2 (TAT 6-24 HRS): SARS Coronavirus 2: NEGATIVE

## 2023-12-10 ENCOUNTER — Emergency Department (HOSPITAL_COMMUNITY)

## 2023-12-10 ENCOUNTER — Other Ambulatory Visit: Payer: Self-pay

## 2023-12-10 ENCOUNTER — Emergency Department (HOSPITAL_COMMUNITY)
Admission: EM | Admit: 2023-12-10 | Discharge: 2023-12-10 | Disposition: A | Discharge status: Home / Self Care | Attending: Emergency Medicine | Admitting: Emergency Medicine

## 2023-12-10 ENCOUNTER — Encounter (HOSPITAL_COMMUNITY): Payer: Self-pay

## 2023-12-10 DIAGNOSIS — Z79899 Other long term (current) drug therapy: Secondary | ICD-10-CM | POA: Insufficient documentation

## 2023-12-10 DIAGNOSIS — M26622 Arthralgia of left temporomandibular joint: Secondary | ICD-10-CM

## 2023-12-10 DIAGNOSIS — I10 Essential (primary) hypertension: Secondary | ICD-10-CM | POA: Insufficient documentation

## 2023-12-10 DIAGNOSIS — R6884 Jaw pain: Secondary | ICD-10-CM | POA: Diagnosis present

## 2023-12-10 DIAGNOSIS — M26621 Arthralgia of right temporomandibular joint: Secondary | ICD-10-CM | POA: Insufficient documentation

## 2023-12-10 LAB — CBC WITH DIFFERENTIAL/PLATELET
Abs Immature Granulocytes: 0.01 K/uL (ref 0.00–0.07)
Basophils Absolute: 0 K/uL (ref 0.0–0.1)
Basophils Relative: 1 %
Eosinophils Absolute: 0.3 K/uL (ref 0.0–0.5)
Eosinophils Relative: 3 %
HCT: 39.4 % (ref 36.0–46.0)
Hemoglobin: 12.7 g/dL (ref 12.0–15.0)
Immature Granulocytes: 0 %
Lymphocytes Relative: 38 %
Lymphs Abs: 2.8 K/uL (ref 0.7–4.0)
MCH: 28.6 pg (ref 26.0–34.0)
MCHC: 32.2 g/dL (ref 30.0–36.0)
MCV: 88.7 fL (ref 80.0–100.0)
Monocytes Absolute: 0.6 K/uL (ref 0.1–1.0)
Monocytes Relative: 8 %
Neutro Abs: 3.7 K/uL (ref 1.7–7.7)
Neutrophils Relative %: 50 %
Platelets: 317 K/uL (ref 150–400)
RBC: 4.44 MIL/uL (ref 3.87–5.11)
RDW: 13.1 % (ref 11.5–15.5)
WBC: 7.3 K/uL (ref 4.0–10.5)
nRBC: 0 % (ref 0.0–0.2)

## 2023-12-10 LAB — COMPREHENSIVE METABOLIC PANEL WITH GFR
ALT: 12 U/L (ref 0–44)
AST: 17 U/L (ref 15–41)
Albumin: 4 g/dL (ref 3.5–5.0)
Alkaline Phosphatase: 103 U/L (ref 38–126)
Anion gap: 10 (ref 5–15)
BUN: 6 mg/dL (ref 6–20)
CO2: 24 mmol/L (ref 22–32)
Calcium: 11.4 mg/dL — ABNORMAL HIGH (ref 8.9–10.3)
Chloride: 104 mmol/L (ref 98–111)
Creatinine, Ser: 0.6 mg/dL (ref 0.44–1.00)
GFR, Estimated: >60 mL/min (ref >60)
Glucose, Bld: 90 mg/dL (ref 70–99)
Potassium: 4 mmol/L (ref 3.5–5.1)
Sodium: 138 mmol/L (ref 135–145)
Total Bilirubin: 0.7 mg/dL (ref 0.0–1.2)
Total Protein: 6.8 g/dL (ref 6.5–8.1)

## 2023-12-10 LAB — TROPONIN I (HIGH SENSITIVITY): Troponin I (High Sensitivity): 2 ng/L (ref <18)

## 2023-12-10 LAB — TSH: TSH: 2.101 u[IU]/mL (ref 0.350–4.500)

## 2023-12-10 MED ORDER — AMLODIPINE BESYLATE 10 MG PO TABS: 10.0000 mg | ORAL_TABLET | Freq: Every day | ORAL | 0 refills | Status: AC

## 2023-12-10 MED ORDER — AMLODIPINE BESYLATE 5 MG PO TABS
5.0000 mg | ORAL_TABLET | Freq: Once | ORAL | Status: AC
  Administered 2023-12-10: 5 mg via ORAL
  Filled 2023-12-10: qty 1

## 2023-12-10 NOTE — ED Notes (Signed)
 Pt given water, tolerated well. Given sandwich bag, pt reports she hasn't eaten all day but has a loss of appetite recently.

## 2023-12-10 NOTE — ED Triage Notes (Addendum)
 Pt bib ems from urgent care; evaluated today for l sided jaw pain x 1 week; states was eating 1 week ago, felt jaw pop, has been having pain since; pain goes to head and L ear; EKG done, unremarkable for ems; c/o HA, loss appetite today, bp 170/104; denies cp, denies sob; HR 64, rr 18, sats 98% RA; recent diagnosis of thyroid disorder; denies pain currently

## 2023-12-10 NOTE — Discharge Instructions (Signed)
 As we discussed, you likely have TMJ pain.  Please take ibuprofen  as needed  Your blood pressure is also elevated.  I have increased your Norvasc  to 10 mg daily  Please follow-up with your doctor  Return to ER if you have severe pain or headache or chest pain

## 2023-12-10 NOTE — ED Provider Triage Note (Signed)
 Emergency Medicine Provider Triage Evaluation Note  Jodi Cochran , a 38 y.o. female  was evaluated in triage.  Pt complains of jaw pain, sent from urgent care.  Patient reports abnormal EKG.  This appears to have some T wave flattening.  Patient reports about 2 weeks of left ear pain and clicking in her left jaw.  No chest pain, shortness of breath, lightheadedness or syncope.  She has a history of hypertension but has been taking her medications inconsistently.  No diabetes or high cholesterol.  Reports that she has upcoming parathyroid surgery.  Review of Systems  Positive: Jaw pain Negative: Chest pain  Physical Exam  BP (!) 166/102   Pulse 70   Resp 16   SpO2 100%  Gen:   Awake, no distress   Resp:  Normal effort  MSK:   Moves extremities without difficulty  Other:  TMs appear normal.  Medical Decision Making  Medically screening exam initiated at 5:22 PM.  Appropriate orders placed.  Jodi Cochran was informed that the remainder of the evaluation will be completed by another provider, this initial triage assessment does not replace that evaluation, and the importance of remaining in the ED until their evaluation is complete.  Patient looks well.  She has left ear and jaw pain.  EKG not overly concerning.  Will defer remainder of workup to next provider.  Low concern clinically for ACS, PE.   Jodi Chew, PA-C 12/10/23 1724

## 2023-12-10 NOTE — ED Notes (Signed)
 X ray in room.

## 2023-12-10 NOTE — ED Provider Notes (Signed)
 [place] [facility] Provider Note   CSN: [id number] Arrival date & time: 12/10/23  1709     Patient presents with: No chief complaint on file.   Jerzie Bieri is a 38 y.o. female history of hypertension, here presenting with headache and left jaw pain.  Patient had left jaw pain for the last week.  Patient felt that her jaw is popping back-and-forth.  Patient also has left ear pain.  Patient went to urgent care and had nonspecific EKG changes.  Patient was sent here to rule out ACS.  Denies any chest pain.  Patient states that she is compliant with her blood pressure medicine and takes 5 mg Norvasc  daily.   The history is provided by the patient.       Prior to Admission medications   Medication Sig Start Date End Date Taking? Authorizing Provider  amLODipine  (NORVASC ) 5 MG tablet Take 1 tablet (5 mg total) by mouth daily. 02/22/20   Wieters, Hallie C, PA-C  benzonatate  (TESSALON ) 100 MG capsule Take 1 capsule (100 mg total) by mouth every 8 (eight) hours. 09/13/19   Landy Honora CROME, PA-C  cetirizine  (ZYRTEC  ALLERGY) 10 MG tablet Take 1 tablet (10 mg total) by mouth daily. 09/13/19   Landy Honora CROME, PA-C  citalopram  (CELEXA ) 20 MG tablet Take 1 tablet (20 mg total) by mouth daily. For depression 07/10/19   Collene Gouge I, NP  fluticasone  (FLONASE ) 50 MCG/ACT nasal spray Place 2 sprays into both nostrils daily. 12/02/20   Schuyler Charlie RAMAN, MD  hydrOXYzine  (ATARAX /VISTARIL ) 25 MG tablet Take 1 tablet (25 mg total) by mouth every 6 (six) hours as needed. For anxiety 07/10/19   Collene Gouge I, NP  ondansetron  (ZOFRAN -ODT) 4 MG disintegrating tablet Take 1 tablet (4 mg total) by mouth every 8 (eight) hours as needed for nausea or vomiting. 05/28/22   Blaise Aleene KIDD, MD    Allergies: Phenergan  [promethazine  hcl]    Review of Systems  HENT:         Left jaw pain  All other systems reviewed and are negative.   Updated Vital Signs BP 130/81   Pulse  74   Temp 98.2 F (36.8 C) (Oral)   Resp 20   SpO2 100%   Physical Exam Vitals and nursing note reviewed.  Constitutional:      Appearance: Normal appearance.  HENT:     Head: Normocephalic.     Nose: Nose normal.     Mouth/Throat:     Mouth: Mucous membranes are moist.     Comments: Patient has good dentition overall.  No obvious periapical abscess.  Left TM is normal.  Patient does have tenderness over the left TMJ Eyes:     Extraocular Movements: Extraocular movements intact.     Pupils: Pupils are equal, round, and reactive to light.  Cardiovascular:     Rate and Rhythm: Normal rate and regular rhythm.     Pulses: Normal pulses.     Heart sounds: Normal heart sounds.  Pulmonary:     Effort: Pulmonary effort is normal.     Breath sounds: Normal breath sounds.  Abdominal:     General: Abdomen is flat.     Palpations: Abdomen is soft.  Musculoskeletal:        General: Normal range of motion.     Cervical back: Normal range of motion and neck supple.  Skin:    General: Skin is warm.     Capillary Refill:  Capillary refill takes less than 2 seconds.  Neurological:     General: No focal deficit present.     Mental Status: She is alert and oriented to person, place, and time.  Psychiatric:        Mood and Affect: Mood normal.        Behavior: Behavior normal.     (all labs ordered are listed, but only abnormal results are displayed) Labs Reviewed  COMPREHENSIVE METABOLIC PANEL WITH GFR - Abnormal; Notable for the following components:      Result Value   Calcium 11.4 (*)    All other components within normal limits  CBC WITH DIFFERENTIAL/PLATELET  TSH  TROPONIN I (HIGH SENSITIVITY)  TROPONIN I (HIGH SENSITIVITY)    EKG: None  Radiology: DG Chest 2 View Result Date: 12/10/2023 CLINICAL DATA:  Left-sided jaw pain. EXAM: CHEST - 2 VIEW COMPARISON:  February 24, 2020 FINDINGS: The heart size and mediastinal contours are within normal limits. Both lungs are clear.  There is evidence of prior open reduction and internal fixation of the left clavicle. IMPRESSION: No active cardiopulmonary disease. Electronically Signed   By: Suzen Dials M.D.   On: 12/10/2023 20:54     Procedures   Medications Ordered in the ED  amLODipine  (NORVASC ) tablet 5 mg (5 mg Oral Given 12/10/23 2118)                                    Medical Decision Making Jessamine Barcia is a 38 y.o. female here presenting with left jaw pain.  Patient had a relatively normal EKG on my exam.  Patient does have some T wave flattening of the lateral leads.  I think patient likely has TMJ pain.  Will get 1 set of troponin and labs.  10:54 PM I reviewed patient's labs and CBC and CMP and troponin unremarkable and TSH is normal.  Chest x-ray is clear.  Patient likely has TMJ pain.  I do not think this is cardiac in nature.  Recommend ibuprofen  as needed.  Stable for discharge.  BP is down to 130 after addition of dose of Norvasc .  Will increase Norvasc  to 10 mg daily.  Encouraged her to follow-up with PCP  Problems Addressed: TMJ tenderness, left: acute illness or injury  Amount and/or Complexity of Data Reviewed Labs: ordered. Decision-making details documented in ED Course. Radiology: ordered and independent interpretation performed. Decision-making details documented in ED Course.  Risk Prescription drug management.    Final diagnoses:  None    ED Discharge Orders     None          Patt Alm Macho, MD 12/10/23 2257
